# Patient Record
Sex: Male | Born: 1970
Health system: Southern US, Community
[De-identification: ages and names within clinical notes are randomized; demographics above are authoritative.]

## PROBLEM LIST (undated history)

## (undated) DIAGNOSIS — J40 Bronchitis, not specified as acute or chronic: Secondary | ICD-10-CM

## (undated) DIAGNOSIS — E785 Hyperlipidemia, unspecified: Secondary | ICD-10-CM

## (undated) DIAGNOSIS — K8020 Calculus of gallbladder without cholecystitis without obstruction: Secondary | ICD-10-CM

## (undated) DIAGNOSIS — K859 Acute pancreatitis without necrosis or infection, unspecified: Secondary | ICD-10-CM

## (undated) DIAGNOSIS — M1990 Unspecified osteoarthritis, unspecified site: Secondary | ICD-10-CM

## (undated) DIAGNOSIS — T7840XA Allergy, unspecified, initial encounter: Secondary | ICD-10-CM

## (undated) DIAGNOSIS — J189 Pneumonia, unspecified organism: Secondary | ICD-10-CM

## (undated) DIAGNOSIS — K219 Gastro-esophageal reflux disease without esophagitis: Secondary | ICD-10-CM

## (undated) HISTORY — DX: Allergy, unspecified, initial encounter: T78.40XA

## (undated) HISTORY — DX: Acute pancreatitis without necrosis or infection, unspecified: K85.90

## (undated) HISTORY — PX: HIP SURGERY: SHX245

## (undated) HISTORY — PX: SHOULDER SURGERY: SHX246

## (undated) HISTORY — DX: Calculus of gallbladder without cholecystitis without obstruction: K80.20

## (undated) HISTORY — PX: CERVICAL DISC SURGERY: SHX588

---

## 2006-06-12 HISTORY — PX: CERVICAL DISC SURGERY: SHX588

## 2007-01-18 ENCOUNTER — Ambulatory Visit (HOSPITAL_COMMUNITY): Admission: RE | Admit: 2007-01-18 | Discharge: 2007-01-19 | Payer: Self-pay | Admitting: Neurosurgery

## 2007-10-25 ENCOUNTER — Emergency Department (HOSPITAL_COMMUNITY): Admission: EM | Admit: 2007-10-25 | Discharge: 2007-10-26 | Payer: Self-pay | Admitting: Emergency Medicine

## 2007-12-03 ENCOUNTER — Emergency Department (HOSPITAL_COMMUNITY): Admission: EM | Admit: 2007-12-03 | Discharge: 2007-12-04 | Payer: Self-pay | Admitting: Emergency Medicine
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
## 2011-03-09 LAB — URINALYSIS, ROUTINE W REFLEX MICROSCOPIC
Bilirubin Urine: NEGATIVE
Hgb urine dipstick: NEGATIVE
Ketones, ur: NEGATIVE
Specific Gravity, Urine: 1.021
pH: 6

## 2011-03-27 LAB — CBC
HCT: 47.5
Hemoglobin: 16
MCHC: 33.6
MCV: 86.6
RDW: 12.8

## 2011-03-27 LAB — BASIC METABOLIC PANEL
CO2: 29
Chloride: 103
Glucose, Bld: 87
Potassium: 4
Sodium: 140

## 2011-05-20 ENCOUNTER — Encounter: Payer: Self-pay | Admitting: *Deleted

## 2011-05-20 ENCOUNTER — Emergency Department (HOSPITAL_COMMUNITY)
Admission: EM | Admit: 2011-05-20 | Discharge: 2011-05-20 | Disposition: A | Discharge status: Home / Self Care | Payer: 59 | Attending: Emergency Medicine | Admitting: Emergency Medicine

## 2011-05-20 DIAGNOSIS — J40 Bronchitis, not specified as acute or chronic: Secondary | ICD-10-CM | POA: Insufficient documentation

## 2011-05-20 DIAGNOSIS — R5381 Other malaise: Secondary | ICD-10-CM | POA: Insufficient documentation

## 2011-05-20 DIAGNOSIS — R5383 Other fatigue: Secondary | ICD-10-CM | POA: Insufficient documentation

## 2011-05-20 DIAGNOSIS — R062 Wheezing: Secondary | ICD-10-CM | POA: Insufficient documentation

## 2011-05-20 DIAGNOSIS — R197 Diarrhea, unspecified: Secondary | ICD-10-CM | POA: Insufficient documentation

## 2011-05-20 DIAGNOSIS — R0602 Shortness of breath: Secondary | ICD-10-CM | POA: Insufficient documentation

## 2011-05-20 DIAGNOSIS — R05 Cough: Secondary | ICD-10-CM | POA: Insufficient documentation

## 2011-05-20 DIAGNOSIS — R42 Dizziness and giddiness: Secondary | ICD-10-CM | POA: Insufficient documentation

## 2011-05-20 DIAGNOSIS — R059 Cough, unspecified: Secondary | ICD-10-CM | POA: Insufficient documentation

## 2011-05-20 HISTORY — DX: Pneumonia, unspecified organism: J18.9

## 2011-05-20 HISTORY — DX: Bronchitis, not specified as acute or chronic: J40

## 2011-05-20 MED ORDER — CEPHALEXIN 500 MG PO CAPS: 500.0000 mg | ORAL_CAPSULE | Freq: Two times a day (BID) | ORAL | Status: AC

## 2011-05-20 MED ORDER — CEPHALEXIN 250 MG PO CAPS
500.0000 mg | ORAL_CAPSULE | Freq: Once | ORAL | Status: AC
  Administered 2011-05-20: 500 mg via ORAL
  Filled 2011-05-20: qty 2

## 2011-05-20 MED ORDER — ALBUTEROL SULFATE HFA 108 (90 BASE) MCG/ACT IN AERS
2.0000 | INHALATION_SPRAY | Freq: Once | RESPIRATORY_TRACT | Status: AC
  Administered 2011-05-20: 2 via RESPIRATORY_TRACT
  Filled 2011-05-20: qty 6.7

## 2011-05-20 NOTE — ED Provider Notes (Signed)
History     CSN: [id number] Arrival date & time: 05/20/2011  7:25 PM   First MD Initiated Contact with Patient 05/20/11 2106      Chief Complaint  Patient presents with  . Bronchitis    (Consider location/radiation/quality/duration/timing/severity/associated sxs/prior treatment) HPI Comments: Patient here after having been seen at PCP yesterday and having been prescribed clarithromycin for bronchitis.  He states he is now having a reaction to the medication including dizziness, diarrhea, weakness and his symptoms are getting worse.  He reports no fever or chills  Patient is a 40 y.o. male presenting with cough. The history is provided by the patient. No language interpreter was used.  Cough This is a new problem. The current episode started 2 days ago. The problem occurs constantly. The problem has not changed since onset.The cough is non-productive. There has been no fever. Associated symptoms include shortness of breath and wheezing. Pertinent negatives include no chest pain, no chills, no ear congestion, no ear pain, no headaches, no rhinorrhea, no sore throat and no myalgias. The treatment provided no relief. He is not a smoker. His past medical history is significant for bronchitis.    Past Medical History  Diagnosis Date  . Bronchitis   . Pneumonia     Past Surgical History  Procedure Date  . Cervical spine surgery     No family history on file.  History  Substance Use Topics  . Smoking status: Never Smoker   . Smokeless tobacco: Not on file  . Alcohol Use: No      Review of Systems  Constitutional: Negative for chills.  HENT: Negative for ear pain, sore throat and rhinorrhea.   Respiratory: Positive for cough, shortness of breath and wheezing.   Cardiovascular: Negative for chest pain.  Musculoskeletal: Negative for myalgias.  Neurological: Negative for headaches.  All other systems reviewed and are negative.    Allergies  Amoxicillin  Home Medications     Current Outpatient Rx  Name Route Sig Dispense Refill  . CLARITHROMYCIN 500 MG PO TABS Oral Take 500 mg by mouth 2 (two) times daily.        BP 105/65  Pulse 85  Temp(Src) 98.9 F (37.2 C) (Oral)  Resp 20  SpO2 100%  Physical Exam  Nursing note and vitals reviewed. Constitutional: He is oriented to person, place, and time. He appears well-developed and well-nourished. No distress.  HENT:  Head: Normocephalic and atraumatic.  Right Ear: External ear normal.  Left Ear: External ear normal.  Nose: Nose normal.  Mouth/Throat: Oropharynx is clear and moist. No oropharyngeal exudate.  Eyes: Conjunctivae are normal. Pupils are equal, round, and reactive to light. No scleral icterus.  Neck: Normal range of motion. Neck supple.  Cardiovascular: Normal rate, regular rhythm and normal heart sounds.   Pulmonary/Chest: Effort normal and breath sounds normal. No respiratory distress. He has no wheezes. He has no rales.  Abdominal: Soft. Bowel sounds are normal. There is no tenderness.  Musculoskeletal: Normal range of motion.  Lymphadenopathy:    He has no cervical adenopathy.  Neurological: He is alert and oriented to person, place, and time. No cranial nerve deficit.  Skin: Skin is warm and dry.  Psychiatric: He has a normal mood and affect. His behavior is normal. Judgment and thought content normal.    ED Course  Procedures (including critical care time)  Labs Reviewed - No data to display No results found.   Bronchitis    MDM  Patient believes that he  may have had a reaction to the biaxin, though this sounds like the common side effects of this medication, his baseline mental status is such that he is having difficulty comprehending this,  I will switch the abx and also give him an inhaler.         Izola Price [place], [place] 05/20/11 2117

## 2011-05-20 NOTE — ED Notes (Signed)
Patient here with c/o bronchitis is getting worse.  Patient states that he saw his MD yesterday for allergic reaction to his medication and was told by his MD that he had bronchitis.  Patient is c/o symptoms getting worse, coughing, weakness, and having diarrhea.

## 2011-05-20 NOTE — ED Notes (Signed)
PA at bedside.

## 2011-05-20 NOTE — ED Notes (Signed)
Patient states that he was diagnosed with bronchitis yesterday (symptoms include coughing and fever); patient states that the side effects from the medications are affecting him -- nausea, vomiting, diarrhea (black in color), and dizziness.  Last emesis was around 1800; patient currently feels nausea.  Patient denies abdominal pain, chest pain, shortness of breath, and numbness/tingling in hands/feet.  Patient states that every time he cough, he experiences pain under his right rib area.  Patient called pharmacy, which referred him to his PCP.  Patient was unable to get a hold of his PCP, so he came to the ED.  Patient alert and oriented x4; PERRL present.  Patient wearing a mask.  Will continue to monitor.

## 2011-05-20 NOTE — ED Notes (Signed)
Patient given discharge paperwork; went over discharge instructions with patient.  Instructed patient to take antibiotic and albuterol inhaler as directed.  Instructed to follow up with primary care physician if symptoms persist and to return to the ED for new, worsening, or concerning symptoms.

## 2011-05-20 NOTE — ED Notes (Signed)
Attempted to discharge patient; patient currently in restroom.

## 2011-05-21 NOTE — ED Provider Notes (Signed)
Medical screening examination/treatment/procedure(s) were performed by non-physician practitioner and as supervising physician I was immediately available for consultation/collaboration.   Lyanne Co, MD 05/21/11 0100

## 2011-11-06 ENCOUNTER — Emergency Department (HOSPITAL_COMMUNITY)
Admission: EM | Admit: 2011-11-06 | Discharge: 2011-11-06 | Disposition: A | Discharge status: Home / Self Care | Payer: 59 | Attending: Emergency Medicine | Admitting: Emergency Medicine

## 2011-11-06 ENCOUNTER — Encounter (HOSPITAL_COMMUNITY): Payer: Self-pay | Admitting: Emergency Medicine

## 2011-11-06 DIAGNOSIS — R109 Unspecified abdominal pain: Secondary | ICD-10-CM | POA: Insufficient documentation

## 2011-11-06 DIAGNOSIS — K5900 Constipation, unspecified: Secondary | ICD-10-CM | POA: Insufficient documentation

## 2011-11-06 DIAGNOSIS — J329 Chronic sinusitis, unspecified: Secondary | ICD-10-CM

## 2011-11-06 DIAGNOSIS — K219 Gastro-esophageal reflux disease without esophagitis: Secondary | ICD-10-CM | POA: Insufficient documentation

## 2011-11-06 HISTORY — DX: Gastro-esophageal reflux disease without esophagitis: K21.9

## 2011-11-06 LAB — DIFFERENTIAL
Basophils Relative: 0 % (ref 0–1)
Eosinophils Absolute: 0 10*3/uL (ref 0.0–0.7)
Monocytes Relative: 7 % (ref 3–12)
Neutrophils Relative %: 89 % — ABNORMAL HIGH (ref 43–77)

## 2011-11-06 LAB — CBC
Hemoglobin: 13.8 g/dL (ref 13.0–17.0)
MCH: 28.8 pg (ref 26.0–34.0)
MCHC: 34.1 g/dL (ref 30.0–36.0)
Platelets: 200 10*3/uL (ref 150–400)
RDW: 12.7 % (ref 11.5–15.5)

## 2011-11-06 LAB — COMPREHENSIVE METABOLIC PANEL
Albumin: 3.7 g/dL (ref 3.5–5.2)
Alkaline Phosphatase: 48 U/L (ref 39–117)
BUN: 12 mg/dL (ref 6–23)
Potassium: 3.2 mEq/L — ABNORMAL LOW (ref 3.5–5.1)
Total Protein: 6.1 g/dL (ref 6.0–8.3)

## 2011-11-06 MED ORDER — POTASSIUM CHLORIDE CRYS ER 20 MEQ PO TBCR
40.0000 meq | EXTENDED_RELEASE_TABLET | Freq: Once | ORAL | Status: AC
  Administered 2011-11-06: 40 meq via ORAL
  Filled 2011-11-06: qty 2

## 2011-11-06 MED ORDER — SULFAMETHOXAZOLE-TRIMETHOPRIM 800-160 MG PO TABS: 1.0000 | ORAL_TABLET | Freq: Two times a day (BID) | ORAL | Status: AC

## 2011-11-06 MED ORDER — ACETAMINOPHEN 325 MG PO TABS
650.0000 mg | ORAL_TABLET | Freq: Once | ORAL | Status: AC
  Administered 2011-11-06: 650 mg via ORAL
  Filled 2011-11-06: qty 2

## 2011-11-06 MED ORDER — ONDANSETRON 8 MG PO TBDP: 8.0000 mg | ORAL_TABLET | Freq: Three times a day (TID) | ORAL | Status: AC | PRN

## 2011-11-06 MED ORDER — ONDANSETRON 8 MG PO TBDP: 8.0000 mg | ORAL_TABLET | Freq: Three times a day (TID) | ORAL | Status: DC | PRN

## 2011-11-06 NOTE — ED Notes (Signed)
Pt given crackers and ginger ale and pt tolerating well.

## 2011-11-06 NOTE — ED Notes (Signed)
Per EMS pt reports nausea x 2 hours with chills. Pt vomited half a green bag worth on the way in the ambulance. Pt in no acute distress.

## 2011-11-06 NOTE — ED Notes (Signed)
Pt HR 120's on the monitor. And sinus tach on the monitor per EMS.

## 2011-11-06 NOTE — Discharge Instructions (Signed)

## 2011-11-06 NOTE — ED Notes (Signed)
[id number]<AV> Expected date:11/06/11<BR> Expected time:12:20 PM<BR> Means of arrival:Ambulance<BR> Comments:<BR> vomiting

## 2011-11-06 NOTE — ED Provider Notes (Signed)
History     CSN: [id number]  Arrival date & time 11/06/11  1222   First MD Initiated Contact with Patient 11/06/11 1309      Chief Complaint  Patient presents with  . Abdominal Pain  . Constipation    (Consider location/radiation/quality/duration/timing/severity/associated sxs/prior treatment) HPI Patient complaining of sinus symptoms with  Nasal congestion for 3 days now with frontal headache, fever, loose stool earlier today, and emesis.  Patient states he had some abdominal discomfort pte but this has resolved.  The frontal headache is pressure and across the forehead.  PMD is Dr. Juleen China.  Pain is constant, non radiating, moderate and improving.  Patient has not taken any medications.  Past Medical History  Diagnosis Date  . Bronchitis   . Pneumonia   . GERD (gastroesophageal reflux disease)     Past Surgical History  Procedure Date  . Cervical spine surgery     No family history on file.  History  Substance Use Topics  . Smoking status: Never Smoker   . Smokeless tobacco: Not on file  . Alcohol Use: No      Review of Systems  All other systems reviewed and are negative.    Allergies  Amoxicillin  Home Medications   Current Outpatient Rx  Name Route Sig Dispense Refill  . ALBUTEROL SULFATE HFA 108 (90 BASE) MCG/ACT IN AERS Inhalation Inhale 2 puffs into the lungs every 6 (six) hours as needed. For shortness of breath.    . ASPIRIN-ACETAMINOPHEN-CAFFEINE 250-250-65 MG PO TABS Oral Take 2 tablets by mouth every 6 (six) hours as needed. For migraines.    Marland Kitchen DIPHENHYDRAMINE HCL 25 MG PO TABS Oral Take 25 mg by mouth every 6 (six) hours as needed. For allergies.    . ADULT MULTIVITAMIN W/MINERALS CH Oral Take 1 tablet by mouth daily.    Marland Kitchen OMEPRAZOLE 20 MG PO CPDR Oral Take 20 mg by mouth daily.      BP 114/74  Pulse 120  Temp 100.9 F (38.3 C)  Resp 20  SpO2 99%  Physical Exam  Nursing note and vitals reviewed. Constitutional: He is oriented to person,  place, and time. He appears well-developed and well-nourished.  HENT:  Head: Normocephalic and atraumatic.  Right Ear: External ear normal.  Left Ear: External ear normal.  Nose: Nose normal.  Mouth/Throat: Oropharynx is clear and moist.  Eyes: EOM are normal. Pupils are equal, round, and reactive to light.  Neck: Normal range of motion. Neck supple.  Cardiovascular: Tachycardia present.   Pulmonary/Chest: Effort normal and breath sounds normal.  Abdominal: Soft. Bowel sounds are normal.  Musculoskeletal: Normal range of motion.  Neurological: He is alert and oriented to person, place, and time. He has normal reflexes.  Skin: Skin is warm and dry.  Psychiatric: He has a normal mood and affect.    ED Course  Procedures (including critical care time)  Labs Reviewed - No data to display No results found.   No diagnosis found.  Results for orders placed during the hospital encounter of 11/06/11  CBC      Component Value Range   WBC 12.6 (*) 4.0 - 10.5 (K/uL)   RBC 4.79  4.22 - 5.81 (MIL/uL)   Hemoglobin 13.8  13.0 - 17.0 (g/dL)   HCT 11.9  14.7 - 82.9 (%)   MCV 84.6  78.0 - 100.0 (fL)   MCH 28.8  26.0 - 34.0 (pg)   MCHC 34.1  30.0 - 36.0 (g/dL)   RDW 12.7  11.5 - 15.5 (%)   Platelets 200  150 - 400 (K/uL)  DIFFERENTIAL      Component Value Range   Neutrophils Relative 89 (*) 43 - 77 (%)   Neutro Abs 11.2 (*) 1.7 - 7.7 (K/uL)   Lymphocytes Relative 3 (*) 12 - 46 (%)   Lymphs Abs 0.4 (*) 0.7 - 4.0 (K/uL)   Monocytes Relative 7  3 - 12 (%)   Monocytes Absolute 0.9  0.1 - 1.0 (K/uL)   Eosinophils Relative 0  0 - 5 (%)   Eosinophils Absolute 0.0  0.0 - 0.7 (K/uL)   Basophils Relative 0  0 - 1 (%)   Basophils Absolute 0.0  0.0 - 0.1 (K/uL)  COMPREHENSIVE METABOLIC PANEL      Component Value Range   Sodium 139  135 - 145 (mEq/L)   Potassium 3.2 (*) 3.5 - 5.1 (mEq/L)   Chloride 104  96 - 112 (mEq/L)   CO2 25  19 - 32 (mEq/L)   Glucose, Bld 86  70 - 99 (mg/dL)   BUN 12  6  - 23 (mg/dL)   Creatinine, Ser 1.61  0.50 - 1.35 (mg/dL)   Calcium 8.3 (*) 8.4 - 10.5 (mg/dL)   Total Protein 6.1  6.0 - 8.3 (g/dL)   Albumin 3.7  3.5 - 5.2 (g/dL)   AST 20  0 - 37 (U/L)   ALT 21  0 - 53 (U/L)   Alkaline Phosphatase 48  39 - 117 (U/L)   Total Bilirubin 0.8  0.3 - 1.2 (mg/dL)   GFR calc non Af Amer 75 (*) >90 (mL/min)   GFR calc Af Amer 87 (*) >90 (mL/min)     MDM  Patient given 2 lites ns and tylenol. Potassium orally repleted. Plan po abx for sinusitis and antiemetics       Hilario Quarry, MD 11/06/11 [phone]

## 2011-11-07 ENCOUNTER — Emergency Department (HOSPITAL_COMMUNITY)
Admission: EM | Admit: 2011-11-07 | Discharge: 2011-11-07 | Disposition: A | Discharge status: Home / Self Care | Payer: 59 | Attending: Emergency Medicine | Admitting: Emergency Medicine

## 2011-11-07 ENCOUNTER — Encounter (HOSPITAL_COMMUNITY): Payer: Self-pay | Admitting: Emergency Medicine

## 2011-11-07 DIAGNOSIS — I10 Essential (primary) hypertension: Secondary | ICD-10-CM | POA: Insufficient documentation

## 2011-11-07 DIAGNOSIS — R112 Nausea with vomiting, unspecified: Secondary | ICD-10-CM

## 2011-11-07 DIAGNOSIS — T887XXA Unspecified adverse effect of drug or medicament, initial encounter: Secondary | ICD-10-CM | POA: Insufficient documentation

## 2011-11-07 DIAGNOSIS — J3489 Other specified disorders of nose and nasal sinuses: Secondary | ICD-10-CM | POA: Insufficient documentation

## 2011-11-07 DIAGNOSIS — R42 Dizziness and giddiness: Secondary | ICD-10-CM | POA: Insufficient documentation

## 2011-11-07 DIAGNOSIS — T50905A Adverse effect of unspecified drugs, medicaments and biological substances, initial encounter: Secondary | ICD-10-CM

## 2011-11-07 LAB — POCT I-STAT, CHEM 8
BUN: 8 mg/dL (ref 6–23)
Creatinine, Ser: 1.3 mg/dL (ref 0.50–1.35)
Potassium: 3.9 mEq/L (ref 3.5–5.1)
Sodium: 140 mEq/L (ref 135–145)

## 2011-11-07 MED ORDER — DOXYCYCLINE HYCLATE 100 MG PO CAPS: 100.0000 mg | ORAL_CAPSULE | Freq: Two times a day (BID) | ORAL | Status: AC

## 2011-11-07 MED ORDER — KETOROLAC TROMETHAMINE 30 MG/ML IJ SOLN
30.0000 mg | Freq: Once | INTRAMUSCULAR | Status: AC
  Administered 2011-11-07: 30 mg via INTRAVENOUS
  Filled 2011-11-07: qty 1

## 2011-11-07 MED ORDER — SODIUM CHLORIDE 0.9 % IV BOLUS (SEPSIS)
1000.0000 mL | Freq: Once | INTRAVENOUS | Status: AC
  Administered 2011-11-07: 1000 mL via INTRAVENOUS

## 2011-11-07 NOTE — ED Provider Notes (Signed)
History     CSN: [id number]  Arrival date & time 11/07/11  0113   First MD Initiated Contact with Patient 11/07/11 [phone]      Chief Complaint  Patient presents with  . Headache  . Nausea    HPI  History provided by the patient. Patient is a 41 year old male with history of bronchitis and cancer social reflux who presents with complaints of nausea vomiting symptoms. Patient reports being seen for similar symptoms with sinus congestion and pressure yesterday. He was given a prescription for an antibiotic for a sinus infection. Patient took one dose and reports having onset of increased nausea symptoms with slight lightheadedness 30 minutes after taking the medication. Patient does report taking this on an empty stomach. He does not know of any prior allergic reactions or adverse reactions to his medicine. Patient also states that he feels dehydrated. He has been drinking fluids. Patient has no history of diabetes. Patient has no other complaints. He denies any fever, chills, sweats.   Past Medical History  Diagnosis Date  . Bronchitis   . Pneumonia   . GERD (gastroesophageal reflux disease)     Past Surgical History  Procedure Date  . Cervical spine surgery     Family History  Problem Relation Age of Onset  . Hypertension Other   . Heart failure Other     History  Substance Use Topics  . Smoking status: Never Smoker   . Smokeless tobacco: Not on file  . Alcohol Use: No      Review of Systems  Constitutional: Negative for fever and chills.  HENT: Positive for congestion and sinus pressure. Negative for rhinorrhea.   Respiratory: Negative for shortness of breath.   Gastrointestinal: Positive for nausea and vomiting. Negative for abdominal pain.    Allergies  Amoxicillin  Home Medications   Current Outpatient Rx  Name Route Sig Dispense Refill  . ALBUTEROL SULFATE HFA 108 (90 BASE) MCG/ACT IN AERS Inhalation Inhale 2 puffs into the lungs every 6 (six) hours as  needed. For shortness of breath.    . ASPIRIN-ACETAMINOPHEN-CAFFEINE 250-250-65 MG PO TABS Oral Take 2 tablets by mouth every 6 (six) hours as needed. For migraines.    Marland Kitchen DIPHENHYDRAMINE HCL 25 MG PO TABS Oral Take 25 mg by mouth every 6 (six) hours as needed. For allergies.    . ADULT MULTIVITAMIN W/MINERALS CH Oral Take 1 tablet by mouth daily.    Marland Kitchen OMEPRAZOLE 20 MG PO CPDR Oral Take 20 mg by mouth daily.    Marland Kitchen ONDANSETRON 8 MG PO TBDP Oral Take 1 tablet (8 mg total) by mouth every 8 (eight) hours as needed for nausea. 20 tablet 0  . SULFAMETHOXAZOLE-TRIMETHOPRIM 800-160 MG PO TABS Oral Take 1 tablet by mouth every 12 (twelve) hours. 20 tablet 0    BP 108/67  Pulse 113  Temp(Src) 98.8 F (37.1 C) (Oral)  Resp 20  Wt 121 lb 6.4 oz (55.067 kg)  SpO2 95%  Physical Exam  Nursing note and vitals reviewed. Constitutional: He is oriented to person, place, and time. He appears well-developed and well-nourished. No distress.  HENT:  Head: Normocephalic and atraumatic.       Nasal congestion present.  Cardiovascular: Normal rate and regular rhythm.   Pulmonary/Chest: Effort normal and breath sounds normal. No respiratory distress. He has no wheezes.  Abdominal: Soft. There is no tenderness. There is no rebound.  Neurological: He is alert and oriented to person, place, and time.  Skin: Skin  is warm.  Psychiatric: He has a normal mood and affect. His behavior is normal.    ED Course  Procedures   Results for orders placed during the hospital encounter of 11/07/11  POCT I-STAT, CHEM 8      Component Value Range   Sodium 140  135 - 145 (mEq/L)   Potassium 3.9  3.5 - 5.1 (mEq/L)   Chloride 105  96 - 112 (mEq/L)   BUN 8  6 - 23 (mg/dL)   Creatinine, Ser 1.61  0.50 - 1.35 (mg/dL)   Glucose, Bld 97  70 - 99 (mg/dL)   Calcium, Ion 0.96  0.45 - 1.32 (mmol/L)   TCO2 23  0 - 100 (mmol/L)   Hemoglobin 13.6  13.0 - 17.0 (g/dL)   HCT 40.9  81.1 - 91.4 (%)       1. Nausea & vomiting   2.  Adverse drug reaction       MDM  Patient seen and evaluated. Patient in no acute distress.   Patient feeling much better after medications. I discussed possibility of nausea related to medications. Symptoms didn't begin to 30 minutes after taking Bactrim. At this time we will try a different antibiotic for sinusitis symptoms.     Angus Seller, [place] 11/08/11 [phone]

## 2011-11-07 NOTE — Discharge Instructions (Signed)
You were seen and evaluated for your nausea vomiting symptoms and general fatigue. Your providers feel that your nausea they have come on from taking your antibiotic medications and having irritation and bad reaction. You have been given a new prescription for a different antibiotic to use for your sinus infection. Please continue to drink plenty of fluids to stay hydrated. Take this medication after eating food. Please followup with your primary care provider for continued evaluation and treatment.   Nausea and Vomiting Nausea is a sick feeling that often comes before throwing up (vomiting). Vomiting is a reflex where stomach contents come out of your mouth. Vomiting can cause severe loss of body fluids (dehydration). Children and elderly adults can become dehydrated quickly, especially if they also have diarrhea. Nausea and vomiting are symptoms of a condition or disease. It is important to find the cause of your symptoms. CAUSES   Direct irritation of the stomach lining. This irritation can result from increased acid production (gastroesophageal reflux disease), infection, food poisoning, taking certain medicines (such as nonsteroidal anti-inflammatory drugs), alcohol use, or tobacco use.   Signals from the brain.These signals could be caused by a headache, heat exposure, an inner ear disturbance, increased pressure in the brain from injury, infection, a tumor, or a concussion, pain, emotional stimulus, or metabolic problems.   An obstruction in the gastrointestinal tract (bowel obstruction).   Illnesses such as diabetes, hepatitis, gallbladder problems, appendicitis, kidney problems, cancer, sepsis, atypical symptoms of a heart attack, or eating disorders.   Medical treatments such as chemotherapy and radiation.   Receiving medicine that makes you sleep (general anesthetic) during surgery.  DIAGNOSIS Your caregiver may ask for tests to be done if the problems do not improve after a few days.  Tests may also be done if symptoms are severe or if the reason for the nausea and vomiting is not clear. Tests may include:  Urine tests.   Blood tests.   Stool tests.   Cultures (to look for evidence of infection).   X-rays or other imaging studies.  Test results can help your caregiver make decisions about treatment or the need for additional tests. TREATMENT You need to stay well hydrated. Drink frequently but in small amounts.You may wish to drink water, sports drinks, clear broth, or eat frozen ice pops or gelatin dessert to help stay hydrated.When you eat, eating slowly may help prevent nausea.There are also some antinausea medicines that may help prevent nausea. HOME CARE INSTRUCTIONS   Take all medicine as directed by your caregiver.   If you do not have an appetite, do not force yourself to eat. However, you must continue to drink fluids.   If you have an appetite, eat a normal diet unless your caregiver tells you differently.   Eat a variety of complex carbohydrates (rice, wheat, potatoes, bread), lean meats, yogurt, fruits, and vegetables.   Avoid high-fat foods because they are more difficult to digest.   Drink enough water and fluids to keep your urine clear or pale yellow.   If you are dehydrated, ask your caregiver for specific rehydration instructions. Signs of dehydration may include:   Severe thirst.   Dry lips and mouth.   Dizziness.   Dark urine.   Decreasing urine frequency and amount.   Confusion.   Rapid breathing or pulse.  SEEK IMMEDIATE MEDICAL CARE IF:   You have blood or brown flecks (like coffee grounds) in your vomit.   You have black or bloody stools.   You  have a severe headache or stiff neck.   You are confused.   You have severe abdominal pain.   You have chest pain or trouble breathing.   You do not urinate at least once every 8 hours.   You develop cold or clammy skin.   You continue to vomit for longer than 24 to 48  hours.   You have a fever.  MAKE SURE YOU:   Understand these instructions.   Will watch your condition.   Will get help right away if you are not doing well or get worse.  Document Released: 05/29/2005 Document Revised: 05/18/2011 Document Reviewed: 10/26/2010 [facility] Patient Information 2012 [place], [place].   RESOURCE GUIDE  Dental Problems  Patients with Medicaid: [facility] [phone] [address]                                           [phone] [address] Phone:  [phone]                                                  Phone:  [phone]  If unable to pay or uninsured, contact:  [facility] or [facility]. to become qualified for the adult dental clinic.  Chronic Pain Problems Contact [facility]  [phone] Patients need to be referred by their primary care doctor.  Insufficient Money for Medicine Contact United Way:  call "211" or [facility] 315-368-5726.  No Primary Care Doctor Call [facility]  628-457-3473 Other agencies that provide inexpensive medical care    [facility]  7061122315    [facility]  [phone]    [facility]  [phone]    [facility]  [phone]    [facility]  [phone]    [facility]  [phone]  Psychological Services [facility]  [phone] [facility]  [phone] [facility]   [phone] (emergency services ([phone])  Substance Abuse Resources [facility]  [phone] [facility] [phone] [facility] [phone] Floydene Flock [phone] Residential & Outpatient Substance Abuse Program  ([phone]  Abuse/Neglect [facility] Child Abuse Hotline 229-219-6027 [facility] Child Abuse Hotline 506-386-0638 (After Hours)  Emergency Shelter [facility] 2344464302  Maternity  Homes [facility] at [facility] [place] [facility] [place] [phone] [facility] [phone]  MRSA Hotline #:   6023088736    [facility] Resources  [facility]     [employer]                          [facility] Dept. [address] [place]                       [address]      [address]  [address]  Sela Hua Phone:  [phone]                                   Phone:  [phone]                 Phone:  [place] Phone:  [place] [phone] (After Hours)

## 2011-11-07 NOTE — ED Notes (Signed)
Pt states about 11 pm he was going to the restroom and felt like he was going to pass out and was brought in by EMS on Monday and was told he had a sinus infection  Pt states he went home about 6pm on Monday  Pt states he continues to feel dehydrated and is nauseated  Pt is c/o headache

## 2011-11-07 NOTE — ED Notes (Signed)
Notified Md of vitals .  Ok to d/c home.

## 2011-11-09 NOTE — ED Provider Notes (Signed)
Medical screening examination/treatment/procedure(s) were performed by non-physician practitioner and as supervising physician I was immediately available for consultation/collaboration.    , MD 11/09/11 2001 

## 2012-02-09 ENCOUNTER — Encounter (HOSPITAL_COMMUNITY): Payer: Self-pay | Admitting: Emergency Medicine

## 2012-02-09 ENCOUNTER — Emergency Department (INDEPENDENT_AMBULATORY_CARE_PROVIDER_SITE_OTHER)
Admission: EM | Admit: 2012-02-09 | Discharge: 2012-02-09 | Disposition: A | Discharge status: Home / Self Care | Payer: Worker's Compensation | Source: Home / Self Care | Attending: Emergency Medicine | Admitting: Emergency Medicine

## 2012-02-09 DIAGNOSIS — L255 Unspecified contact dermatitis due to plants, except food: Secondary | ICD-10-CM

## 2012-02-09 DIAGNOSIS — L237 Allergic contact dermatitis due to plants, except food: Secondary | ICD-10-CM

## 2012-02-09 MED ORDER — METHYLPREDNISOLONE ACETATE 80 MG/ML IJ SUSP: 80.0000 mg | Freq: Once | INTRAMUSCULAR | Status: DC

## 2012-02-09 MED ORDER — METHYLPREDNISOLONE ACETATE 80 MG/ML IJ SUSP
INTRAMUSCULAR | Status: AC
  Filled 2012-02-09: qty 1

## 2012-02-09 MED ORDER — TRIAMCINOLONE ACETONIDE 0.1 % EX CREA: TOPICAL_CREAM | Freq: Three times a day (TID) | CUTANEOUS | Status: AC

## 2012-02-09 MED ORDER — PREDNISONE 10 MG PO TABS: ORAL_TABLET | ORAL | Status: DC

## 2012-02-09 NOTE — ED Notes (Signed)
C/o poison ivy from exposure on job w [employer]

## 2012-02-09 NOTE — ED Provider Notes (Signed)
Chief Complaint  Patient presents with  . 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 For the past 2-3 days he's had a pruritic rash on his thighs and arms. He was exposed to some poison ivy at work several days before the symptoms began. He denies any breathing difficulties, coughing, wheezing, or swelling of the lips, tongue, or throat.  Review of Systems:  Other than noted above, the patient denies any of the following symptoms: Systemic:  No fever, chills, sweats, weight loss, or fatigue. ENT:  No nasal congestion, rhinorrhea, sore throat, swelling of lips, tongue or throat. Resp:  No cough, wheezing, or shortness of breath. Skin:  No rash, itching, nodules, or suspicious lesions.  PMFSH:  Past medical history, family history, social history, meds, and allergies were reviewed.  Physical Exam:   Vital signs:  BP 121/79  Pulse 66  Temp 98.2 F (36.8 C) (Oral)  Resp 16  SpO2 100% Gen:  Alert, oriented, in no distress. ENT:  Pharynx clear, no intraoral lesions, moist mucous membranes. Lungs:  Clear to auscultation. Skin:  He has streaks and patches of maculopapules and vesicles on his thighs and arms.  Course in Urgent Care Center:   He was given Depo-Medrol 80 mg IM and tolerated this well without any immediate side effects.  Assessment:  The encounter diagnosis was Poison ivy.  Plan:   1.  The following meds were prescribed:   New Prescriptions   PREDNISONE (DELTASONE) 10 MG TABLET    Take 4 tabs daily for 4 days, 3 tabs daily for 4 days, 2 tabs daily for 4 days, then 1 tab daily for 4 days.   TRIAMCINOLONE CREAM (KENALOG) 0.1 %    Apply topically 3 (three) times daily.   2.  The patient was instructed in symptomatic care and handouts were given. 3.  The patient was told to return if becoming worse in any way, if no better in 3 or 4 days, and given some red flag symptoms that would indicate  earlier return.     Reuben Likes, MD 02/09/12 2150

## 2012-08-22 ENCOUNTER — Ambulatory Visit: Payer: 59

## 2012-08-22 ENCOUNTER — Ambulatory Visit (INDEPENDENT_AMBULATORY_CARE_PROVIDER_SITE_OTHER): Payer: 59 | Admitting: Emergency Medicine

## 2012-08-22 VITALS — BP 112/72 | HR 100 | Temp 98.2°F | Resp 16 | Ht 62.0 in | Wt 118.0 lb

## 2012-08-22 DIAGNOSIS — I959 Hypotension, unspecified: Secondary | ICD-10-CM

## 2012-08-22 DIAGNOSIS — R059 Cough, unspecified: Secondary | ICD-10-CM

## 2012-08-22 DIAGNOSIS — J811 Chronic pulmonary edema: Secondary | ICD-10-CM

## 2012-08-22 DIAGNOSIS — R05 Cough: Secondary | ICD-10-CM

## 2012-08-22 DIAGNOSIS — H6120 Impacted cerumen, unspecified ear: Secondary | ICD-10-CM

## 2012-08-22 DIAGNOSIS — J019 Acute sinusitis, unspecified: Secondary | ICD-10-CM

## 2012-08-22 LAB — POCT CBC
Lymph, poc: 0.8 (ref 0.6–3.4)
MCHC: 32.9 g/dL (ref 31.8–35.4)
MID (cbc): 0.5 (ref 0–0.9)
MPV: 10.5 fL (ref 0–99.8)
POC Granulocyte: 5.9 (ref 2–6.9)
POC LYMPH PERCENT: 10.9 %L (ref 10–50)
POC MID %: 6.4 %M (ref 0–12)
RDW, POC: 13.4 %

## 2012-08-22 MED ORDER — BENZONATATE 100 MG PO CAPS: 100.0000 mg | ORAL_CAPSULE | Freq: Three times a day (TID) | ORAL | Status: DC | PRN

## 2012-08-22 MED ORDER — LEVOFLOXACIN 500 MG PO TABS: 500.0000 mg | ORAL_TABLET | Freq: Every day | ORAL | Status: AC

## 2012-08-22 NOTE — Progress Notes (Signed)
While reviewing chart - Dr Cleta Alberts became concerned regarding his low blood pressure and had the patient return to the clinic for orthostatics.  He was mildly orthostatic upon his arrival.  An IV was started and he was given 2L NS and his vitals improved and he felt much better. He was discharged home to push fluids take medications and with instructions to return tomorrow if he was worse.

## 2012-08-22 NOTE — Progress Notes (Deleted)
  Subjective:    Patient ID: Harry Morales, male    DOB: [birth year], 42 y.o.   MRN: [id number]  HPI    Review of Systems     Objective:   Physical Exam        Assessment & Plan:

## 2012-08-22 NOTE — Progress Notes (Signed)
  Subjective:    Patient ID: Harry Morales, male    DOB: 04-26-1971, 42 y.o.   MRN: [id number]  URI  This is a new problem. The current episode started in the past 7 days (8 days). The problem has been gradually worsening. There has been no fever. Associated symptoms include congestion, coughing, headaches, nausea, sinus pain and sneezing. He has tried antihistamine for the symptoms. The treatment provided no relief.  Cough Associated symptoms include headaches and postnasal drip. Pertinent negatives include no chills or fever.   Patient comes into our office today with complaints  Of a bad cough with sinus and chest congestion that has lasted for 8 day has been taking nyiquil, delsym, dayquil, and benadryl nothing has helped with cough. Cough gets worse at night.He coughs up yellow Phlegm.No chest pain. No weakness or dizziness.No shortness of breath.    Review of Systems  Constitutional: Positive for fatigue. Negative for fever and chills.  HENT: Positive for congestion, sneezing and postnasal drip.   Respiratory: Positive for cough.   Gastrointestinal: Positive for nausea.  Neurological: Positive for headaches.       Objective:   Physical Examthere is scaring of the left tympanic membrain right wax in right ear nose congested. Throat normal. Chest revealsRhochi in both lungs fields.Patient alert cooperative in NAD.  [facility] reading (PRIMARY) by  Dr. NAD        Assessment & Plan:  Will treat with levaquin,tessalon, and mucinex.BP low. No signs of sepsis. Alert and conversive. Ambulates without difficulty. Advised to rest and drink lots and recheck if worsening.

## 2012-08-22 NOTE — Patient Instructions (Signed)
Sinusitis Sinusitis is redness, soreness, and swelling (inflammation) of the paranasal sinuses. Paranasal sinuses are air pockets within the bones of your face (beneath the eyes, the middle of the forehead, or above the eyes). In healthy paranasal sinuses, mucus is able to drain out, and air is able to circulate through them by way of your nose. However, when your paranasal sinuses are inflamed, mucus and air can become trapped. This can allow bacteria and other germs to grow and cause infection. Sinusitis can develop quickly and last only a short time (acute) or continue over a long period (chronic). Sinusitis that lasts for more than 12 weeks is considered chronic.  CAUSES  Causes of sinusitis include:  Allergies.  Structural abnormalities, such as displacement of the cartilage that separates your nostrils (deviated septum), which can decrease the air flow through your nose and sinuses and affect sinus drainage.  Functional abnormalities, such as when the small hairs (cilia) that line your sinuses and help remove mucus do not work properly or are not present. SYMPTOMS  Symptoms of acute and chronic sinusitis are the same. The primary symptoms are pain and pressure around the affected sinuses. Other symptoms include:  Upper toothache.  Earache.  Headache.  Bad breath.  Decreased sense of smell and taste.  A cough, which worsens when you are lying flat.  Fatigue.  Fever.  Thick drainage from your nose, which often is green and may contain pus (purulent).  Swelling and warmth over the affected sinuses. DIAGNOSIS  Your caregiver will perform a physical exam. During the exam, your caregiver may:  Look in your nose for signs of abnormal growths in your nostrils (nasal polyps).  Tap over the affected sinus to check for signs of infection.  View the inside of your sinuses (endoscopy) with a special imaging device with a light attached (endoscope), which is inserted into your  sinuses. If your caregiver suspects that you have chronic sinusitis, one or more of the following tests may be recommended:  Allergy tests.  Nasal culture A sample of mucus is taken from your nose and sent to a lab and screened for bacteria.  Nasal cytology A sample of mucus is taken from your nose and examined by your caregiver to determine if your sinusitis is related to an allergy. TREATMENT  Most cases of acute sinusitis are related to a viral infection and will resolve on their own within 10 days. Sometimes medicines are prescribed to help relieve symptoms (pain medicine, decongestants, nasal steroid sprays, or saline sprays).  However, for sinusitis related to a bacterial infection, your caregiver will prescribe antibiotic medicines. These are medicines that will help kill the bacteria causing the infection.  Rarely, sinusitis is caused by a fungal infection. In theses cases, your caregiver will prescribe antifungal medicine. For some cases of chronic sinusitis, surgery is needed. Generally, these are cases in which sinusitis recurs more than 3 times per year, despite other treatments. HOME CARE INSTRUCTIONS   Drink plenty of water. Water helps thin the mucus so your sinuses can drain more easily.  Use a humidifier.  Inhale steam 3 to 4 times a day (for example, sit in the bathroom with the shower running).  Apply a warm, moist washcloth to your face 3 to 4 times a day, or as directed by your caregiver.  Use saline nasal sprays to help moisten and clean your sinuses.  Take over-the-counter or prescription medicines for pain, discomfort, or fever only as directed by your caregiver. SEEK IMMEDIATE MEDICAL   CARE IF:  You have increasing pain or severe headaches.  You have nausea, vomiting, or drowsiness.  You have swelling around your face.  You have vision problems.  You have a stiff neck.  You have difficulty breathing. MAKE SURE YOU:   Understand these  instructions.  Will watch your condition.  Will get help right away if you are not doing well or get worse. Document Released: 05/29/2005 Document Revised: 08/21/2011 Document Reviewed: 06/13/2011 [employer] Patient Information 2013 [employer]. Acute Bronchitis You have acute bronchitis. This means you have a chest cold. The airways in your lungs are red and sore (inflamed). Acute means it is sudden onset.  CAUSES Bronchitis is most often caused by the same virus that causes a cold. SYMPTOMS   Body aches.  Chest congestion.  Chills.  Cough.  Fever.  Shortness of breath.  Sore throat. TREATMENT  Acute bronchitis is usually treated with rest, fluids, and medicines for relief of fever or cough. Most symptoms should go away after a few days or a week. Increased fluids may help thin your secretions and will prevent dehydration. Your caregiver may give you an inhaler to improve your symptoms. The inhaler reduces shortness of breath and helps control cough. You can take over-the-counter pain relievers or cough medicine to decrease coughing, pain, or fever. A cool-air vaporizer may help thin bronchial secretions and make it easier to clear your chest. Antibiotics are usually not needed but can be prescribed if you smoke, are seriously ill, have chronic lung problems, are elderly, or you are at higher risk for developing complications.Allergies and asthma can make bronchitis worse. Repeated episodes of bronchitis may cause longstanding lung problems. Avoid smoking and secondhand smoke.Exposure to cigarette smoke or irritating chemicals will make bronchitis worse. If you are a cigarette smoker, consider using nicotine gum or skin patches to help control withdrawal symptoms. Quitting smoking will help your lungs heal faster. Recovery from bronchitis is often slow, but you should start feeling better after 2 to 3 days. Cough from bronchitis frequently lasts for 3 to 4 weeks. To prevent  another bout of acute bronchitis:  Quit smoking.  Wash your hands frequently to get rid of viruses or use a hand sanitizer.  Avoid other people with cold or virus symptoms.  Try not to touch your hands to your mouth, nose, or eyes. SEEK IMMEDIATE MEDICAL CARE IF:  You develop increased fever, chills, or chest pain.  You have severe shortness of breath or bloody sputum.  You develop dehydration, fainting, repeated vomiting, or a severe headache.  You have no improvement after 1 week of treatment or you get worse. MAKE SURE YOU:   Understand these instructions.  Will watch your condition.  Will get help right away if you are not doing well or get worse. Document Released: 07/06/2004 Document Revised: 08/21/2011 Document Reviewed: 09/21/2010 [employer] Patient Information 2013 [employer].  

## 2012-08-22 NOTE — Progress Notes (Signed)
x

## 2012-08-24 ENCOUNTER — Telehealth: Payer: Self-pay | Admitting: *Deleted

## 2012-08-24 NOTE — Telephone Encounter (Signed)
Dr Cleta Alberts advised pt to get neosynephrine otc nasal spray.

## 2012-08-26 ENCOUNTER — Telehealth: Payer: Self-pay

## 2012-08-26 NOTE — Telephone Encounter (Signed)
Note provided for patient  

## 2012-08-26 NOTE — Telephone Encounter (Signed)
PT STATES DR DAUB WAS GOING TO WRITE HIM A NOTE IF HE NEEDED IT AND WOULD LIKE TO HAVE ONE FOR TODAY AND TOMORROW PLEASE CALL [phone]

## 2013-02-21 ENCOUNTER — Ambulatory Visit (INDEPENDENT_AMBULATORY_CARE_PROVIDER_SITE_OTHER): Payer: 59 | Admitting: Physician Assistant

## 2013-02-21 VITALS — BP 120/72 | HR 70 | Temp 98.0°F | Resp 18 | Ht 62.5 in | Wt 126.0 lb

## 2013-02-21 DIAGNOSIS — M79645 Pain in left finger(s): Secondary | ICD-10-CM

## 2013-02-21 DIAGNOSIS — M79609 Pain in unspecified limb: Secondary | ICD-10-CM

## 2013-02-21 DIAGNOSIS — S61209A Unspecified open wound of unspecified finger without damage to nail, initial encounter: Secondary | ICD-10-CM

## 2013-02-21 NOTE — Patient Instructions (Addendum)

## 2013-02-21 NOTE — Progress Notes (Signed)
  Subjective:    Patient ID: Harry Morales, male    DOB: 28-Jul-1970, 42 y.o.   MRN: [id number]  HPI 42 year old male presents with laceration to left index finger. Injury occurred this morning on a paper shredder.  States he thinks it is small but due to the amount of bleeding after the injury, decided he should come in for evaluation and possible sutures.  No paresthesias or weakness. Last tetanus 3 years ago.   Patient is otherwise doing well with no other concerns today.      Review of Systems  Musculoskeletal: Negative for joint swelling.  Skin: Positive for wound.       Objective:   Physical Exam  Constitutional: He is oriented to person, place, and time. He appears well-developed and well-nourished.  HENT:  Head: Normocephalic and atraumatic.  Right Ear: External ear normal.  Left Ear: External ear normal.  Eyes: Conjunctivae are normal.  Cardiovascular: Normal rate.   Pulmonary/Chest: Effort normal.  Neurological: He is alert and oriented to person, place, and time.  Skin:  1 cm laceration on pad of left index finger. Superficial without involvement of deeper structures.  5/5 strength. Sensation normal. Capillary refill <2 seconds  Psychiatric: He has a normal mood and affect. His behavior is normal. Judgment and thought content normal.     VCO. Metacarpal block with 2% lidocaine plain.  Cleaned with soap and water.  SP. Wound explored revealing no deep structures or FB.  Repaired with #2 SI sutures using 5-0 ethilon. Cleaned and bandaged. Patient tolerated well.      Assessment & Plan:  Wound, open, finger, initial encounter  Pain in finger of left hand  Wound care provided for patient RTC precautions discussed Return in 7 days for suture removal.

## 2013-02-27 ENCOUNTER — Ambulatory Visit (INDEPENDENT_AMBULATORY_CARE_PROVIDER_SITE_OTHER): Payer: 59 | Admitting: Physician Assistant

## 2013-02-27 DIAGNOSIS — Z4802 Encounter for removal of sutures: Secondary | ICD-10-CM

## 2013-02-27 NOTE — Progress Notes (Signed)
  Subjective:    Patient ID: Harry Morales, male    DOB: 18-Jul-1970, 42 y.o.   MRN: [id number]  HPI   Harry Morales is a very pleasant 42 yr old male here for removal of sutures placed here 02/21/13.  Reports he is doing well.  No pain, drainage, swelling, redness.   Review of Systems  Skin: Positive for wound.  All other systems reviewed and are negative.       Objective:   Physical Exam  Vitals reviewed. Constitutional: He is oriented to person, place, and time. He appears well-developed and well-nourished. No distress.  HENT:  Head: Normocephalic and atraumatic.  Eyes: Conjunctivae are normal. No scleral icterus.  Pulmonary/Chest: Effort normal.  Neurological: He is alert and oriented to person, place, and time.  Skin: Skin is warm and dry.  Well healed laceration at left index finger; sutures removed without difficulty  Psychiatric: He has a normal mood and affect. His behavior is normal.        Assessment & Plan:  Visit for suture removal   Harry Morales is a pleasant 42 yr old male here for removal of sutures.  Wound is well healed.  Sutures removed without difficulty.  RTC as needs arise.

## 2013-03-07 ENCOUNTER — Ambulatory Visit (INDEPENDENT_AMBULATORY_CARE_PROVIDER_SITE_OTHER): Payer: 59 | Admitting: Emergency Medicine

## 2013-03-07 VITALS — BP 112/80 | HR 102 | Temp 98.8°F | Resp 18 | Ht 61.75 in | Wt 124.8 lb

## 2013-03-07 DIAGNOSIS — S139XXA Sprain of joints and ligaments of unspecified parts of neck, initial encounter: Secondary | ICD-10-CM

## 2013-03-07 MED ORDER — CYCLOBENZAPRINE HCL 10 MG PO TABS: 10.0000 mg | ORAL_TABLET | Freq: Three times a day (TID) | ORAL | Status: DC | PRN

## 2013-03-07 MED ORDER — TRAMADOL HCL 50 MG PO TABS
50.0000 mg | ORAL_TABLET | Freq: Four times a day (QID) | ORAL | Status: DC | PRN
Start: 2013-03-07 — End: 2013-03-18

## 2013-03-07 MED ORDER — NAPROXEN SODIUM 550 MG PO TABS: 550.0000 mg | ORAL_TABLET | Freq: Two times a day (BID) | ORAL | Status: DC

## 2013-03-07 NOTE — Patient Instructions (Addendum)

## 2013-03-07 NOTE — Progress Notes (Signed)
[facility] [address], [place] [address] [phone]  Date:  03/07/2013   Name:  Harry Morales   DOB:  18-Feb-1971   MRN:  [id number]  PCP:  No primary provider on file.    Chief Complaint: Neck Pain   History of Present Illness:  Harry Morales is a 42 y.o. very pleasant male patient who presents with the following:  Underwent a right C5-6 fusion about 6 years ago and has experienced no adverse effect and his pain that led to the procedure was resolved.  He now comes to the office with a complaint of pain in the right upper arm that radiates down to the elbow.  No weakness but is experiencing some tingling in the right shoulder in the c-5 dermatome.  No history of injury or overuse.  Denies other complaint.  No improvement with over the counter medications or other home remedies.   There are no active problems to display for this patient.   Past Medical History  Diagnosis Date  . Bronchitis   . Pneumonia   . GERD (gastroesophageal reflux disease)   . Allergy     Past Surgical History  Procedure Laterality Date  . Cervical spine surgery    . Hernia repair    . Spine surgery    . Neck surgery      History  Substance Use Topics  . Smoking status: Never Smoker   . Smokeless tobacco: Never Used  . Alcohol Use: No    Family History  Problem Relation Age of Onset  . Hypertension Other   . Heart failure Other   . Diverticulitis Mother     Allergies  Allergen Reactions  . Penicillins   . Amoxicillin Rash    Medication list has been reviewed and updated.  Current Outpatient Prescriptions on File Prior to Visit  Medication Sig Dispense Refill  . aspirin-acetaminophen-caffeine (EXCEDRIN MIGRAINE) 250-250-65 MG per tablet Take 2 tablets by mouth every 6 (six) hours as needed. For migraines.      . diphenhydrAMINE (BENADRYL) 25 MG tablet Take 25 mg by mouth every 6 (six) hours as needed. For allergies.      . Multiple Vitamin  (MULITIVITAMIN WITH MINERALS) TABS Take 1 tablet by mouth daily.      . Ranitidine HCl (ZANTAC PO) Take by mouth.      Marland Kitchen albuterol (PROVENTIL HFA;VENTOLIN HFA) 108 (90 BASE) MCG/ACT inhaler Inhale 2 puffs into the lungs every 6 (six) hours as needed. For shortness of breath.      . benzonatate (TESSALON) 100 MG capsule Take 1-2 capsules (100-200 mg total) by mouth 3 (three) times daily as needed for cough.  40 capsule  0  . omeprazole (PRILOSEC) 20 MG capsule Take 20 mg by mouth daily.      . predniSONE (DELTASONE) 10 MG tablet Take 4 tabs daily for 4 days, 3 tabs daily for 4 days, 2 tabs daily for 4 days, then 1 tab daily for 4 days.  40 tablet  0   No current facility-administered medications on file prior to visit.    Review of Systems:  As per HPI, otherwise negative.    Physical Examination: Filed Vitals:   03/07/13 0844  BP: 112/80  Pulse: 102  Temp: 98.8 F (37.1 C)  Resp: 18   Filed Vitals:   03/07/13 0844  Height: 5' 1.75" (1.568 m)  Weight: 124 lb 12.8 oz (56.609 kg)   Body mass index is  23.02 kg/(m^2). Ideal Body Weight: Weight in (lb) to have BMI = 25: 135.3  GEN: WDWN, NAD, Non-toxic, A & O x 3 HEENT: Atraumatic, Normocephalic. Neck supple. No masses, No LAD. Ears and Nose: No external deformity. NECK:  Tender right trapezius CV: RRR, No M/G/R. No JVD. No thrill. No extra heart sounds. PULM: CTA B, no wheezes, crackles, rhonchi. No retractions. No resp. distress. No accessory muscle use. ABD: S, NT, ND, +BS. No rebound. No HSM. EXTR: No c/c/e NEURO Normal gait. Balance and coordination. PSYCH: Normally interactive. Conversant. Not depressed or anxious appearing.  Calm demeanor.    Assessment and Plan: Cervical strain Anaprox Flexeril Follow up in one week   Signed,  Phillips Odor, MD

## 2013-03-18 ENCOUNTER — Ambulatory Visit (INDEPENDENT_AMBULATORY_CARE_PROVIDER_SITE_OTHER): Payer: 59 | Admitting: Family Medicine

## 2013-03-18 VITALS — BP 118/66 | HR 120 | Temp 98.1°F | Resp 18 | Ht 61.75 in | Wt 124.0 lb

## 2013-03-18 DIAGNOSIS — M5412 Radiculopathy, cervical region: Secondary | ICD-10-CM

## 2013-03-18 DIAGNOSIS — M5010 Cervical disc disorder with radiculopathy, unspecified cervical region: Secondary | ICD-10-CM

## 2013-03-18 MED ORDER — PREDNISONE 20 MG PO TABS: ORAL_TABLET | ORAL | Status: DC

## 2013-03-18 MED ORDER — CYCLOBENZAPRINE HCL 10 MG PO TABS: 10.0000 mg | ORAL_TABLET | Freq: Three times a day (TID) | ORAL | Status: DC | PRN

## 2013-03-18 MED ORDER — TRAMADOL HCL 50 MG PO TABS: 50.0000 mg | ORAL_TABLET | Freq: Four times a day (QID) | ORAL | Status: DC | PRN

## 2013-03-18 NOTE — Progress Notes (Signed)
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  He was seen last week by Dr. Dareen Piano for a recurrence of neck pain in right neck.  He had no h/o new trauma or activity, so he thinks he must have "slept wrong."  Pain seemed to worsen last night and he couldn't sleep.  The pain is radiating down the back side of his right hand all the way down his arm.  He doesn't have pain with movement of neck.  Prior surgery was on the left side of the neck.  He takes benadryl for allergies and zantac for GERD.  He takes Excedrin for migraines.  Objective:  NAD, alert holding right lower neck  Neck:  Good ROM, tender right lower neck Reflexes:  Diminished right triceps.  Symmetric biceps jerks Motor:  Diminished triceps strength..  Skin:  Normal  Assessment:  C6 disc disease with acute exacerbation  Plan:  Prednisone, flexeril Recheck this weekend. Cervical disc disorder with radiculopathy of cervical region - Plan: traMADol (ULTRAM) 50 MG tablet, cyclobenzaprine (FLEXERIL) 10 MG tablet, predniSONE (DELTASONE) 20 MG tablet    Signed,  Elvina Sidle, MD

## 2013-03-22 ENCOUNTER — Ambulatory Visit (INDEPENDENT_AMBULATORY_CARE_PROVIDER_SITE_OTHER): Payer: 59 | Admitting: Emergency Medicine

## 2013-03-22 VITALS — BP 118/62 | HR 111 | Temp 98.1°F | Resp 16 | Ht 61.5 in | Wt 130.0 lb

## 2013-03-22 DIAGNOSIS — M5412 Radiculopathy, cervical region: Secondary | ICD-10-CM

## 2013-03-22 DIAGNOSIS — M5010 Cervical disc disorder with radiculopathy, unspecified cervical region: Secondary | ICD-10-CM

## 2013-03-22 MED ORDER — TRAMADOL HCL 50 MG PO TABS: 50.0000 mg | ORAL_TABLET | Freq: Four times a day (QID) | ORAL | Status: DC | PRN

## 2013-03-22 MED ORDER — CYCLOBENZAPRINE HCL 10 MG PO TABS: 10.0000 mg | ORAL_TABLET | Freq: Three times a day (TID) | ORAL | Status: DC | PRN

## 2013-03-22 MED ORDER — NAPROXEN SODIUM 550 MG PO TABS: 550.0000 mg | ORAL_TABLET | Freq: Two times a day (BID) | ORAL | Status: DC

## 2013-03-22 NOTE — Patient Instructions (Signed)
Cervical Radiculopathy  Cervical radiculopathy happens when a nerve in the neck is pinched or bruised by a slipped (herniated) disk or by arthritic changes in the bones of the cervical spine. This can occur due to an injury or as part of the normal aging process. Pressure on the cervical nerves can cause pain or numbness that runs from your neck all the way down into your arm and fingers.  CAUSES   There are many possible causes, including:   Injury.   Muscle tightness in the neck from overuse.   Swollen, painful joints (arthritis).   Breakdown or degeneration in the bones and joints of the spine (spondylosis) due to aging.   Bone spurs that may develop near the cervical nerves.  SYMPTOMS   Symptoms include pain, weakness, or numbness in the affected arm and hand. Pain can be severe or irritating. Symptoms may be worse when extending or turning the neck.  DIAGNOSIS   Your caregiver will ask about your symptoms and do a physical exam. He or she may test your strength and reflexes. X-rays, CT scans, and MRI scans may be needed in cases of injury or if the symptoms do not go away after a period of time. Electromyography (EMG) or nerve conduction testing may be done to study how your nerves and muscles are working.  TREATMENT   Your caregiver may recommend certain exercises to help relieve your symptoms. Cervical radiculopathy can, and often does, get better with time and treatment. If your problems continue, treatment options may include:   Wearing a soft collar for short periods of time.   Physical therapy to strengthen the neck muscles.   Medicines, such as nonsteroidal anti-inflammatory drugs (NSAIDs), oral corticosteroids, or spinal injections.   Surgery. Different types of surgery may be done depending on the cause of your problems.  HOME CARE INSTRUCTIONS    Put ice on the affected area.   Put ice in a plastic bag.   Place a towel between your skin and the bag.    Leave the ice on for 15-20 minutes, 3-4 times a day or as directed by your caregiver.   If ice does not help, you can try using heat. Take a warm shower or bath, or use a hot water bottle as directed by your caregiver.   You may try a gentle neck and shoulder massage.   Use a flat pillow when you sleep.   Only take over-the-counter or prescription medicines for pain, discomfort, or fever as directed by your caregiver.   If physical therapy was prescribed, follow your caregiver's directions.   If a soft collar was prescribed, use it as directed.  SEEK IMMEDIATE MEDICAL CARE IF:    Your pain gets much worse and cannot be controlled with medicines.   You have weakness or numbness in your hand, arm, face, or leg.   You have a high fever or a stiff, rigid neck.   You lose bowel or bladder control (incontinence).   You have trouble with walking, balance, or speaking.  MAKE SURE YOU:    Understand these instructions.   Will watch your condition.   Will get help right away if you are not doing well or get worse.  Document Released: 02/21/2001 Document Revised: 08/21/2011 Document Reviewed: 01/10/2011  [employer] Patient Information 2014 [employer].

## 2013-03-22 NOTE — Progress Notes (Signed)
[facility] [address] [place] [address] [phone]  Date:  03/22/2013   Name:  Harry Morales   DOB:  1971/05/03   MRN:  [id number]  PCP:  No primary provider on file.    Chief Complaint: Follow-up   History of Present Illness:  Harry Morales is a 42 y.o. very pleasant male patient who presents with the following:  Follow up for cervical neuritis.  Continues to have paresthesias right arm and tingling of the thumb and index finger.  Some improvement with treatment but still symptoms are prominent.  Denies other complaint or health concern today.   There are no active problems to display for this patient.   Past Medical History  Diagnosis Date  . Bronchitis   . Pneumonia   . GERD (gastroesophageal reflux disease)   . Allergy     Past Surgical History  Procedure Laterality Date  . Cervical spine surgery    . Hernia repair    . Spine surgery    . Neck surgery      History  Substance Use Topics  . Smoking status: Never Smoker   . Smokeless tobacco: Never Used  . Alcohol Use: No    Family History  Problem Relation Age of Onset  . Hypertension Other   . Heart failure Other   . Diverticulitis Mother     Allergies  Allergen Reactions  . Penicillins   . Amoxicillin Rash    Medication list has been reviewed and updated.  Current Outpatient Prescriptions on File Prior to Visit  Medication Sig Dispense Refill  . albuterol (PROVENTIL HFA;VENTOLIN HFA) 108 (90 BASE) MCG/ACT inhaler Inhale 2 puffs into the lungs every 6 (six) hours as needed. For shortness of breath.      Marland Kitchen aspirin-acetaminophen-caffeine (EXCEDRIN MIGRAINE) 250-250-65 MG per tablet Take 2 tablets by mouth every 6 (six) hours as needed. For migraines.      . cyclobenzaprine (FLEXERIL) 10 MG tablet Take 1 tablet (10 mg total) by mouth 3 (three) times daily as needed for muscle spasms.  30 tablet  0  . diphenhydrAMINE (BENADRYL) 25 MG tablet Take 25 mg by mouth every  6 (six) hours as needed. For allergies.      . Multiple Vitamin (MULITIVITAMIN WITH MINERALS) TABS Take 1 tablet by mouth daily.      . naproxen sodium (ANAPROX DS) 550 MG tablet Take 1 tablet (550 mg total) by mouth 2 (two) times daily with a meal.  40 tablet  0  . omeprazole (PRILOSEC) 20 MG capsule Take 20 mg by mouth daily.      . predniSONE (DELTASONE) 20 MG tablet 2 daily with food  10 tablet  1  . Ranitidine HCl (ZANTAC PO) Take by mouth.      . traMADol (ULTRAM) 50 MG tablet Take 1 tablet (50 mg total) by mouth every 6 (six) hours as needed for pain.  30 tablet  0   No current facility-administered medications on file prior to visit.    Review of Systems:  As per HPI, otherwise negative.    Physical Examination: Filed Vitals:   03/22/13 0757  BP: 118/62  Pulse: 111  Temp: 98.1 F (36.7 C)  Resp: 16   Filed Vitals:   03/22/13 0757  Height: 5' 1.5" (1.562 m)  Weight: 130 lb (58.968 kg)   Body mass index is 24.17 kg/(m^2). Ideal Body Weight: Weight in (lb) to have BMI = 25: 134.2  GEN: WDWN, NAD, Non-toxic, Alert & Oriented x 3 HEENT: Atraumatic, Normocephalic.  Ears and Nose: No external deformity. EXTR: No clubbing/cyanosis/edema NEURO: Normal gait.  Weak triceps right PSYCH: Normally interactive. Conversant. Not depressed or anxious appearing.  Calm demeanor.    Assessment and Plan: Cervical radiculopathy MRI Continue meds   Signed,  Phillips Odor, MD

## 2013-03-31 ENCOUNTER — Ambulatory Visit: Payer: 59

## 2013-03-31 ENCOUNTER — Ambulatory Visit (INDEPENDENT_AMBULATORY_CARE_PROVIDER_SITE_OTHER): Payer: 59 | Admitting: Family Medicine

## 2013-03-31 VITALS — BP 100/60 | HR 116 | Temp 98.0°F | Resp 18 | Ht 63.0 in | Wt 122.0 lb

## 2013-03-31 DIAGNOSIS — M5412 Radiculopathy, cervical region: Secondary | ICD-10-CM

## 2013-03-31 DIAGNOSIS — M5010 Cervical disc disorder with radiculopathy, unspecified cervical region: Secondary | ICD-10-CM

## 2013-03-31 MED ORDER — TRAMADOL HCL 50 MG PO TABS: 50.0000 mg | ORAL_TABLET | Freq: Four times a day (QID) | ORAL | Status: DC | PRN

## 2013-03-31 MED ORDER — CYCLOBENZAPRINE HCL 10 MG PO TABS: 10.0000 mg | ORAL_TABLET | Freq: Three times a day (TID) | ORAL | Status: DC | PRN

## 2013-03-31 MED ORDER — NAPROXEN SODIUM 550 MG PO TABS: 550.0000 mg | ORAL_TABLET | Freq: Two times a day (BID) | ORAL | Status: AC

## 2013-03-31 NOTE — Progress Notes (Signed)
Subjective:    Patient ID: Harry Morales, male    DOB: 02/12/1971, 42 y.o.   MRN: [id number] Chief Complaint  Patient presents with  . Follow-up    Neck pain    HPI  His neck has been bothering him more this weekend.  He had c-spine fusion 6 yrs ago and now there is concerned for C6 radiculopathy so wants to f/u with MRI.  He has tried to make an appt with Dr. Hunt Oris with [facility] but is waiting to hear back from their office.  He also tried to see the "city doctor" but they couldn't see him today - Dr. Lucia Gaskins - he plans to see her tomorrow.  Yesterday the pain and soreness radiating through lateral right arm and tingling in right 1st finger has been worst. No longer throbbing.  The muscle relaxant, prednisone, and pain medicine have all helped a lot.  Is out of all of them.  Prednisone did help a lot  - he finished the course 5d ago and 3d ago the pain came back severely.  Is on regular duty at work but called in sick today for these visits.  We are still waiting on insurance authorization for his MRI so it has not been scheduled yet.  Past Medical History  Diagnosis Date  . Bronchitis   . Pneumonia   . GERD (gastroesophageal reflux disease)   . Allergy    Current Outpatient Prescriptions on File Prior to Visit  Medication Sig Dispense Refill  . albuterol (PROVENTIL HFA;VENTOLIN HFA) 108 (90 BASE) MCG/ACT inhaler Inhale 2 puffs into the lungs every 6 (six) hours as needed. For shortness of breath.      . diphenhydrAMINE (BENADRYL) 25 MG tablet Take 25 mg by mouth every 6 (six) hours as needed. For allergies.      . Multiple Vitamin (MULITIVITAMIN WITH MINERALS) TABS Take 1 tablet by mouth daily.      . Ranitidine HCl (ZANTAC PO) Take by mouth.      . cyclobenzaprine (FLEXERIL) 10 MG tablet Take 1 tablet (10 mg total) by mouth 3 (three) times daily as needed for muscle spasms.  30 tablet  0  . naproxen sodium (ANAPROX DS) 550 MG tablet Take 1 tablet (550 mg total) by  mouth 2 (two) times daily with a meal.  40 tablet  0  . traMADol (ULTRAM) 50 MG tablet Take 1 tablet (50 mg total) by mouth every 6 (six) hours as needed for pain.  30 tablet  0   No current facility-administered medications on file prior to visit.   Allergies  Allergen Reactions  . Penicillins   . Amoxicillin Rash     Review of Systems  Constitutional: Positive for activity change. Negative for fever, chills and appetite change.  Gastrointestinal: Negative for abdominal pain, diarrhea and constipation.  Genitourinary: Negative for urgency, frequency, decreased urine volume and difficulty urinating.  Musculoskeletal: Positive for arthralgias, back pain, myalgias, neck pain and neck stiffness. Negative for gait problem and joint swelling.  Skin: Negative for color change and wound.  Neurological: Positive for weakness and numbness. Negative for dizziness and light-headedness.      BP 100/60  Pulse 116  Temp(Src) 98 F (36.7 C) (Oral)  Resp 18  Ht 5\' 3"  (1.6 m)  Wt 122 lb (55.339 kg)  BMI 21.62 kg/m2  SpO2 99% Objective:   Physical Exam  Constitutional: He is oriented to person, place, and time. He appears well-developed and well-nourished. No  distress.  HENT:  Head: Normocephalic and atraumatic.  Eyes: No scleral icterus.  Neck: Neck supple. Muscular tenderness present. No spinous process tenderness present. No rigidity. Decreased range of motion present. No edema and no erythema present.  Pulmonary/Chest: Effort normal.  Musculoskeletal:       Cervical back: He exhibits decreased range of motion, tenderness, pain and spasm.  Neurological: He is alert and oriented to person, place, and time. He displays atrophy. He displays no tremor. A sensory deficit is present. He exhibits normal muscle tone. Gait normal.  Reflex Scores:      Tricep reflexes are 2+ on the right side and 2+ on the left side.      Bicep reflexes are 2+ on the right side and 2+ on the left side.       Brachioradialis reflexes are 2+ on the right side and 2+ on the left side. Skin: Skin is warm and dry. He is not diaphoretic.  Psychiatric: He has a normal mood and affect. His behavior is normal.  C-spine: Decreased flexion and extension, normal lateral flexion and rotation, neg Spurling's test. DTRs 2+ throughout upper ext. Nml sensation to light touch bilaterally except decreased in right index finger. Weakness on tricep and deltoid testion 4+/5 on right. nml bicep, nml grip and opposition     [facility] reading (PRIMARY) by  Dr. Clelia Croft.  cspine: c5-6 fusion hardware in place. DDD of C6-7. Assessment & Plan:   Cervical disc disorder with radiculopathy of cervical region - Plan: traMADol (ULTRAM) 50 MG tablet, cyclobenzaprine (FLEXERIL) 10 MG tablet, DG Cervical Spine Complete Got c-spine MRI scheduled today while pt was in office for Sat 10/25 - rec f/u w/ neurosurgery next wk after MRI - if he can't get appt w/ neurosurg than return here.  Cont light duty at work - minimize use of Rt hand and no lifting above 25 lbs. Meds refilled. If sxs worsen in the meantime, cons repeating course of prednisone. Meds ordered this encounter  Medications  . traMADol (ULTRAM) 50 MG tablet    Sig: Take 1 tablet (50 mg total) by mouth every 6 (six) hours as needed for pain.    Dispense:  30 tablet    Refill:  0  . cyclobenzaprine (FLEXERIL) 10 MG tablet    Sig: Take 1 tablet (10 mg total) by mouth 3 (three) times daily as needed for muscle spasms.    Dispense:  30 tablet    Refill:  0  . naproxen sodium (ANAPROX DS) 550 MG tablet    Sig: Take 1 tablet (550 mg total) by mouth 2 (two) times daily with a meal.    Dispense:  40 tablet    Refill:  0    Norberto Sorenson, MD MPH

## 2013-03-31 NOTE — Patient Instructions (Addendum)
You have your neck MRI scheduled for 10/25 at 8:45 Sat at [facility] [address].  Plan to come back here on Sun or Monday to recheck and review MRI results or you can follow-up with your neurosurgeon instead after this.  When you get your MRI, ask for a copy of the disc so you can bring it to your neurosurgeon's office.  Cervical Radiculopathy Cervical radiculopathy happens when a nerve in the neck is pinched or bruised by a slipped (herniated) disk or by arthritic changes in the bones of the cervical spine. This can occur due to an injury or as part of the normal aging process. Pressure on the cervical nerves can cause pain or numbness that runs from your neck all the way down into your arm and fingers. CAUSES  There are many possible causes, including:  Injury.  Muscle tightness in the neck from overuse.  Swollen, painful joints (arthritis).  Breakdown or degeneration in the bones and joints of the spine (spondylosis) due to aging.  Bone spurs that may develop near the cervical nerves. SYMPTOMS  Symptoms include pain, weakness, or numbness in the affected arm and hand. Pain can be severe or irritating. Symptoms may be worse when extending or turning the neck. DIAGNOSIS  Your caregiver will ask about your symptoms and do a physical exam. He or she may test your strength and reflexes. X-rays, CT scans, and MRI scans may be needed in cases of injury or if the symptoms do not go away after a period of time. Electromyography (EMG) or nerve conduction testing may be done to study how your nerves and muscles are working. TREATMENT  Your caregiver may recommend certain exercises to help relieve your symptoms. Cervical radiculopathy can, and often does, get better with time and treatment. If your problems continue, treatment options may include:  Wearing a soft collar for short periods of time.  Physical therapy to strengthen the neck muscles.  Medicines, such as nonsteroidal  anti-inflammatory drugs (NSAIDs), oral corticosteroids, or spinal injections.  Surgery. Different types of surgery may be done depending on the cause of your problems. HOME CARE INSTRUCTIONS   Put ice on the affected area.  Put ice in a plastic bag.  Place a towel between your skin and the bag.  Leave the ice on for 15-20 minutes, 3-4 times a day or as directed by your caregiver.  If ice does not help, you can try using heat. Take a warm shower or bath, or use a hot water bottle as directed by your caregiver.  You may try a gentle neck and shoulder massage.  Use a flat pillow when you sleep.  Only take over-the-counter or prescription medicines for pain, discomfort, or fever as directed by your caregiver.  If physical therapy was prescribed, follow your caregiver's directions.  If a soft collar was prescribed, use it as directed. SEEK IMMEDIATE MEDICAL CARE IF:   Your pain gets much worse and cannot be controlled with medicines.  You have weakness or numbness in your hand, arm, face, or leg.  You have a high fever or a stiff, rigid neck.  You lose bowel or bladder control (incontinence).  You have trouble with walking, balance, or speaking. MAKE SURE YOU:   Understand these instructions.  Will watch your condition.  Will get help right away if you are not doing well or get worse. Document Released: 02/21/2001 Document Revised: 08/21/2011 Document Reviewed: 01/10/2011 [facility] Patient Information 2014 [place], [place].

## 2013-04-05 ENCOUNTER — Ambulatory Visit
Admission: RE | Admit: 2013-04-05 | Discharge: 2013-04-05 | Disposition: A | Discharge status: Home / Self Care | Payer: 59 | Source: Ambulatory Visit | Attending: Emergency Medicine | Admitting: Emergency Medicine

## 2013-04-05 DIAGNOSIS — M5010 Cervical disc disorder with radiculopathy, unspecified cervical region: Secondary | ICD-10-CM

## 2013-04-06 ENCOUNTER — Other Ambulatory Visit: Payer: Self-pay | Admitting: Emergency Medicine

## 2013-04-06 DIAGNOSIS — M5010 Cervical disc disorder with radiculopathy, unspecified cervical region: Secondary | ICD-10-CM

## 2014-03-10 ENCOUNTER — Ambulatory Visit (INDEPENDENT_AMBULATORY_CARE_PROVIDER_SITE_OTHER): Payer: 59 | Admitting: Family Medicine

## 2014-03-10 VITALS — BP 104/74 | HR 95 | Temp 97.9°F | Resp 18 | Ht 63.0 in | Wt 121.4 lb

## 2014-03-10 DIAGNOSIS — J320 Chronic maxillary sinusitis: Secondary | ICD-10-CM

## 2014-03-10 MED ORDER — AZITHROMYCIN 250 MG PO TABS: ORAL_TABLET | ORAL | Status: DC

## 2014-03-10 NOTE — Progress Notes (Signed)
Patient ID: Harry Morales MRN: [id number], DOB: 09/13/1970, 43 y.o. Date of Encounter: 03/10/2014, 10:25 AM  Primary Physician: No primary provider on file.  Chief Complaint:  Chief Complaint  Patient presents with  . Sinusitis    when laying down pt cant stop coughing x 1 day. No sob. No phlegm just dry cough    HPI: 43 y.o. year old male who works for [employer] and presents with 3 day history of nasal congestion, post nasal drip, sore throat, sinus pressure, and cough. Afebrile. No chills. Nasal congestion thick and green/yellow. Sinus pressure is the worst symptom. Cough is productive secondary to post nasal drip and not associated with time of day. Ears feel full, leading to sensation of muffled hearing. Has tried OTC cold preps without success. No GI complaints.   No recent antibiotics, recent travels, vomiting, or sick contacts   No leg trauma, sedentary periods, h/o cancer, or tobacco use.  Using benadryl, delsym  Past Medical History  Diagnosis Date  . Bronchitis   . Pneumonia   . GERD (gastroesophageal reflux disease)   . Allergy      Home Meds: Prior to Admission medications   Medication Sig Start Date End Date Taking? Authorizing Provider  albuterol (PROVENTIL HFA;VENTOLIN HFA) 108 (90 BASE) MCG/ACT inhaler Inhale 2 puffs into the lungs every 6 (six) hours as needed. For shortness of breath.   Yes Historical Provider, MD  cyclobenzaprine (FLEXERIL) 10 MG tablet Take 1 tablet (10 mg total) by mouth 3 (three) times daily as needed for muscle spasms. 03/31/13  Yes Sherren MochaEva N Shaw, MD  diphenhydrAMINE (BENADRYL) 25 MG tablet Take 25 mg by mouth every 6 (six) hours as needed. For allergies.   Yes Historical Provider, MD  Multiple Vitamin (MULITIVITAMIN WITH MINERALS) TABS Take 1 tablet by mouth daily.   Yes Historical Provider, MD  naproxen sodium (ANAPROX DS) 550 MG tablet Take 1 tablet (550 mg total) by mouth 2 (two) times daily with a meal. 03/31/13 03/31/14   Sherren MochaEva N Shaw, MD  Ranitidine HCl (ZANTAC PO) Take by mouth.    Historical Provider, MD  traMADol (ULTRAM) 50 MG tablet Take 1 tablet (50 mg total) by mouth every 6 (six) hours as needed for pain. 03/31/13   Sherren MochaEva N Shaw, MD    Allergies:  Allergies  Allergen Reactions  . Penicillins   . Amoxicillin Rash    History   Social History  . Marital Status: Single    Spouse Name: N/A    Number of Children: N/A  . Years of Education: N/A   Occupational History  . Not on file.   Social History Main Topics  . Smoking status: Never Smoker   . Smokeless tobacco: Never Used  . Alcohol Use: Yes     Comment: 2 a year  . Drug Use: No  . Sexual Activity: Not on file   Other Topics Concern  . Not on file   Social History Narrative  . No narrative on file     Review of Systems: Constitutional: negative for chills, fever, night sweats or weight changes Cardiovascular: negative for chest pain or palpitations Respiratory: negative for hemoptysis, wheezing, or shortness of breath Abdominal: negative for abdominal pain, nausea, vomiting or diarrhea Dermatological: negative for rash Neurologic: negative for headache   Physical Exam: Blood pressure 104/74, pulse 95, temperature 97.9 F (36.6 C), temperature source Oral, resp. rate 18, height 5\' 3"  (1.6 m), weight 121 lb 6.4 oz (55.067 kg),  SpO2 98.00%., Body mass index is 21.51 kg/(m^2). General: Well developed, well nourished, in no acute distress. Head: Normocephalic, atraumatic, eyes without discharge, sclera non-icteric, nares are congested. Bilateral auditory canals clear, TM's are without perforation, pearly grey with reflective cone of light bilaterally. Serous effusion bilaterally behind TM's. Maxillary sinus TTP. Oral cavity moist, dentition normal. Posterior pharynx with post nasal drip and mild erythema. No peritonsillar abscess or tonsillar exudate. Neck: Supple. No thyromegaly. Full ROM. No lymphadenopathy. Lungs: Clear bilaterally  to auscultation without wheezes, rales, or rhonchi. Breathing is unlabored.  Heart: RRR with S1 S2. No murmurs, rubs, or gallops appreciated. Msk:  Strength and tone normal for age. Extremities: No clubbing or cyanosis. No edema. Neuro: Alert and oriented X 3. Moves all extremities spontaneously. CNII-XII grossly in tact. Psych:  Responds to questions appropriately with a normal affect.    ASSESSMENT AND PLAN:  43 y.o. year old male with sinusitis -Maxillary sinusitis, unspecified chronicity - Plan: azithromycin (ZITHROMAX Z-PAK) 250 MG tablet     -Rest/fluids -RTC precautions -RTC 3-5 days if no improvement  Signed, Elvina Sidle, MD 03/10/2014 10:25 AM

## 2014-03-10 NOTE — Patient Instructions (Signed)

## 2014-06-15 ENCOUNTER — Ambulatory Visit (INDEPENDENT_AMBULATORY_CARE_PROVIDER_SITE_OTHER): Payer: 59

## 2014-06-15 ENCOUNTER — Ambulatory Visit (INDEPENDENT_AMBULATORY_CARE_PROVIDER_SITE_OTHER): Payer: 59 | Admitting: Emergency Medicine

## 2014-06-15 VITALS — BP 110/62 | HR 81 | Temp 97.8°F | Resp 18 | Ht 62.5 in | Wt 126.0 lb

## 2014-06-15 DIAGNOSIS — J320 Chronic maxillary sinusitis: Secondary | ICD-10-CM

## 2014-06-15 DIAGNOSIS — J029 Acute pharyngitis, unspecified: Secondary | ICD-10-CM

## 2014-06-15 DIAGNOSIS — R05 Cough: Secondary | ICD-10-CM

## 2014-06-15 DIAGNOSIS — R059 Cough, unspecified: Secondary | ICD-10-CM

## 2014-06-15 LAB — POCT CBC
Granulocyte percent: 80 %G (ref 37–80)
HCT, POC: 47.3 % (ref 43.5–53.7)
HEMOGLOBIN: 15.9 g/dL (ref 14.1–18.1)
LYMPH, POC: 1.5 (ref 0.6–3.4)
MCH: 29.6 pg (ref 27–31.2)
MCHC: 33.7 g/dL (ref 31.8–35.4)
MCV: 88 fL (ref 80–97)
MID (cbc): 0.7 (ref 0–0.9)
MPV: 8.2 fL (ref 0–99.8)
POC Granulocyte: 8.6 — AB (ref 2–6.9)
POC LYMPH PERCENT: 13.5 %L (ref 10–50)
POC MID %: 6.5 % (ref 0–12)
Platelet Count, POC: 250 10*3/uL (ref 142–424)
RBC: 5.37 M/uL (ref 4.69–6.13)
RDW, POC: 13.3 %
WBC: 10.8 10*3/uL — AB (ref 4.6–10.2)

## 2014-06-15 LAB — POCT RAPID STREP A (OFFICE): Rapid Strep A Screen: NEGATIVE

## 2014-06-15 MED ORDER — AZITHROMYCIN 250 MG PO TABS: ORAL_TABLET | ORAL | Status: DC

## 2014-06-15 MED ORDER — BENZONATATE 100 MG PO CAPS: 100.0000 mg | ORAL_CAPSULE | Freq: Three times a day (TID) | ORAL | Status: DC | PRN

## 2014-06-15 MED ORDER — ALBUTEROL SULFATE HFA 108 (90 BASE) MCG/ACT IN AERS: INHALATION_SPRAY | RESPIRATORY_TRACT | Status: AC

## 2014-06-15 NOTE — Patient Instructions (Signed)

## 2014-06-15 NOTE — Progress Notes (Addendum)
Subjective:  This chart was scribed for Harry Lites, MD by Harry Morales, ED Scribe at [facility].The patient was seen in exam room 10 and the patient's care was started at 9:28 AM.   Patient ID: Harry Morales, male    DOB: [birth year], 44 y.o.   MRN: [id number] Chief Complaint  Patient presents with  . Cough    x5 days   . Nasal Congestion   HPI HPI Comments: Harry Morales is a 44 y.o. male who presents to [facility] complaining of a non-productive cough and nasal congestion, onset 4 days ago. Laying down worsens his cough. He has an intermittent sore throat and trouble sleeping as associated symptoms. Pt has taken mucinex, dayquil and nyquil for little relief. Pt has had sick contacts. He does not have a history of asthma. Pt has had the flu shot. He does not smoke. He denies fever and wheezing. Pt works in [employer], [employer] for the [place]. He has tubes in both ears.  There are no active problems to display for this patient.  Past Medical History  Diagnosis Date  . Bronchitis   . Pneumonia   . GERD (gastroesophageal reflux disease)   . Allergy    Past Surgical History  Procedure Laterality Date  . Cervical spine surgery    . Hernia repair    . Spine surgery    . Neck surgery     Allergies  Allergen Reactions  . Penicillins   . Amoxicillin Rash   Prior to Admission medications   Medication Sig Start Date End Date Taking? Authorizing Provider  albuterol (PROVENTIL HFA;VENTOLIN HFA) 108 (90 BASE) MCG/ACT inhaler Inhale 2 puffs into the lungs every 6 (six) hours as needed. For shortness of breath.   Yes Historical Provider, MD  cyclobenzaprine (FLEXERIL) 10 MG tablet Take 1 tablet (10 mg total) by mouth 3 (three) times daily as needed for muscle spasms. 03/31/13  Yes Sherren Mocha, MD  diphenhydrAMINE (BENADRYL) 25 MG tablet Take 25 mg by mouth every 6 (six) hours as needed. For allergies.   Yes Historical Provider, MD  Multiple Vitamin (MULITIVITAMIN WITH  MINERALS) TABS Take 1 tablet by mouth daily.   Yes Historical Provider, MD  Pseudoephedrine-APAP-DM (DAYQUIL PO) Take by mouth.   Yes Historical Provider, MD  Pseudoephedrine-Guaifenesin [facility] D PO) Take by mouth.   Yes Historical Provider, MD  traMADol (ULTRAM) 50 MG tablet Take 1 tablet (50 mg total) by mouth every 6 (six) hours as needed for pain. 03/31/13  Yes Sherren Mocha, MD  azithromycin (ZITHROMAX Z-PAK) 250 MG tablet Take as directed on pack Patient not taking: Reported on 06/15/2014 03/10/14   Elvina Sidle, MD  Ranitidine HCl (ZANTAC PO) Take by mouth.    Historical Provider, MD   History   Social History  . Marital Status: Single    Spouse Name: N/A    Number of Children: N/A  . Years of Education: N/A   Occupational History  . Not on file.   Social History Main Topics  . Smoking status: Never Smoker   . Smokeless tobacco: Never Used  . Alcohol Use: Yes     Comment: 2 a year  . Drug Use: No  . Sexual Activity: Not on file   Other Topics Concern  . Not on file   Social History Narrative   Review of Systems  Constitutional: Negative for fever.  HENT: Positive for congestion.   Respiratory: Positive for cough. Negative  for wheezing.   Psychiatric/Behavioral: Positive for sleep disturbance.      Objective:  BP 110/62 mmHg  Pulse 81  Temp(Src) 97.8 F (36.6 C) (Oral)  Resp 18  Ht 5' 2.5" (1.588 m)  Wt 126 lb (57.153 kg)  BMI 22.66 kg/m2  SpO2 99%  Physical Exam  Constitutional: He is oriented to person, place, and time. He appears well-developed and well-nourished.  HENT:  Head: Normocephalic and atraumatic.  Scarring of both ear drums. Throat is red.  Eyes: EOM are normal.  Neck: Normal range of motion.  Cardiovascular: Normal rate.   Pulmonary/Chest: Effort normal and breath sounds normal. He has no wheezes. He has no rales.  Lungs sound clear.  Musculoskeletal: Normal range of motion.  Neurological: He is alert and oriented to person, place, and  time.  Skin: Skin is warm and dry.  Psychiatric: He has a normal mood and affect. His behavior is normal.  Nursing note and vitals reviewed.  Results for orders placed or performed in visit on 06/15/14  POCT CBC  Result Value Ref Range   WBC 10.8 (A) 4.6 - 10.2 K/uL   Lymph, poc 1.5 0.6 - 3.4   POC LYMPH PERCENT 13.5 10 - 50 %L   MID (cbc) 0.7 0 - 0.9   POC MID % 6.5 0 - 12 %M   POC Granulocyte 8.6 (A) 2 - 6.9   Granulocyte percent 80.0 37 - 80 %G   RBC 5.37 4.69 - 6.13 M/uL   Hemoglobin 15.9 14.1 - 18.1 g/dL   HCT, POC 16.1 09.6 - 53.7 %   MCV 88.0 80 - 97 fL   MCH, POC 29.6 27 - 31.2 pg   MCHC 33.7 31.8 - 35.4 g/dL   RDW, POC 04.5 %   Platelet Count, POC 250 142 - 424 K/uL   MPV 8.2 0 - 99.8 fL  POCT rapid strep A  Result Value Ref Range   Rapid Strep A Screen Negative Negative  [facility] reading (PRIMARY) by  Dr.Daub there are no definite pneumonic infiltrates heart size normal no pneumothorax     Assessment & Plan:  White count is somewhat elevated. We'll treat with Tessalon Perles and Z-Pak. He was given a note out of work until Thursday. I did refill his albuterol inhaler to have if he feels any wheezing in his chest. I personally performed the services described in this documentation, which was scribed in my presence. The recorded information has been reviewed and is accurate.I personally performed the services described in this documentation, which was scribed in my presence. The recorded information has been reviewed and is accurate.

## 2015-04-08 ENCOUNTER — Ambulatory Visit (INDEPENDENT_AMBULATORY_CARE_PROVIDER_SITE_OTHER): Payer: 59 | Admitting: Family Medicine

## 2015-04-08 VITALS — BP 102/60 | HR 88 | Temp 98.2°F | Resp 16 | Ht 62.5 in | Wt 129.0 lb

## 2015-04-08 DIAGNOSIS — R05 Cough: Secondary | ICD-10-CM | POA: Diagnosis not present

## 2015-04-08 DIAGNOSIS — R059 Cough, unspecified: Secondary | ICD-10-CM

## 2015-04-08 DIAGNOSIS — J3089 Other allergic rhinitis: Secondary | ICD-10-CM

## 2015-04-08 MED ORDER — BENZONATATE 100 MG PO CAPS: 100.0000 mg | ORAL_CAPSULE | Freq: Three times a day (TID) | ORAL | Status: DC | PRN

## 2015-04-08 MED ORDER — FLUTICASONE PROPIONATE 50 MCG/ACT NA SUSP: 2.0000 | Freq: Every day | NASAL | Status: DC

## 2015-04-08 NOTE — Patient Instructions (Signed)
Upper Respiratory Infection, Adult Most upper respiratory infections (URIs) are a viral infection of the air passages leading to the lungs. A URI affects the nose, throat, and upper air passages. The most common type of URI is nasopharyngitis and is typically referred to as "the common cold." URIs run their course and usually go away on their own. Most of the time, a URI does not require medical attention, but sometimes a bacterial infection in the upper airways can follow a viral infection. This is called a secondary infection. Sinus and middle ear infections are common types of secondary upper respiratory infections. Bacterial pneumonia can also complicate a URI. A URI can worsen asthma and chronic obstructive pulmonary disease (COPD). Sometimes, these complications can require emergency medical care and may be life threatening.  CAUSES Almost all URIs are caused by viruses. A virus is a type of germ and can spread from one person to another.  RISKS FACTORS You may be at risk for a URI if:   You smoke.   You have chronic heart or lung disease.  You have a weakened defense (immune) system.   You are very young or very old.   You have nasal allergies or asthma.  You work in crowded or poorly ventilated areas.  You work in health care facilities or schools. SIGNS AND SYMPTOMS  Symptoms typically develop 2-3 days after you come in contact with a cold virus. Most viral URIs last 7-10 days. However, viral URIs from the influenza virus (flu virus) can last 14-18 days and are typically more severe. Symptoms may include:   Runny or stuffy (congested) nose.   Sneezing.   Cough.   Sore throat.   Headache.   Fatigue.   Fever.   Loss of appetite.   Pain in your forehead, behind your eyes, and over your cheekbones (sinus pain).  Muscle aches.  DIAGNOSIS  Your health care provider may diagnose a URI by:  Physical exam.  Tests to check that your symptoms are not due to  another condition such as:  Strep throat.  Sinusitis.  Pneumonia.  Asthma. TREATMENT  A URI goes away on its own with time. It cannot be cured with medicines, but medicines may be prescribed or recommended to relieve symptoms. Medicines may help:  Reduce your fever.  Reduce your cough.  Relieve nasal congestion. HOME CARE INSTRUCTIONS   Take medicines only as directed by your health care provider.   Gargle warm saltwater or take cough drops to comfort your throat as directed by your health care provider.  Use a warm mist humidifier or inhale steam from a shower to increase air moisture. This may make it easier to breathe.  Drink enough fluid to keep your urine clear or pale yellow.   Eat soups and other clear broths and maintain good nutrition.   Rest as needed.   Return to work when your temperature has returned to normal or as your health care provider advises. You may need to stay home longer to avoid infecting others. You can also use a face mask and careful hand washing to prevent spread of the virus.  Increase the usage of your inhaler if you have asthma.   Do not use any tobacco products, including cigarettes, chewing tobacco, or electronic cigarettes. If you need help quitting, ask your health care provider. PREVENTION  The best way to protect yourself from getting a cold is to practice good hygiene.   Avoid oral or hand contact with people with cold   symptoms.   Wash your hands often if contact occurs.  There is no clear evidence that vitamin C, vitamin E, echinacea, or exercise reduces the chance of developing a cold. However, it is always recommended to get plenty of rest, exercise, and practice good nutrition.  SEEK MEDICAL CARE IF:   You are getting worse rather than better.   Your symptoms are not controlled by medicine.   You have chills.  You have worsening shortness of breath.  You have brown or red mucus.  You have yellow or brown nasal  discharge.  You have pain in your face, especially when you bend forward.  You have a fever.  You have swollen neck glands.  You have pain while swallowing.  You have white areas in the back of your throat. SEEK IMMEDIATE MEDICAL CARE IF:   You have severe or persistent:  Headache.  Ear pain.  Sinus pain.  Chest pain.  You have chronic lung disease and any of the following:  Wheezing.  Prolonged cough.  Coughing up blood.  A change in your usual mucus.  You have a stiff neck.  You have changes in your:  Vision.  Hearing.  Thinking.  Mood. MAKE SURE YOU:   Understand these instructions.  Will watch your condition.  Will get help right away if you are not doing well or get worse.   This information is not intended to replace advice given to you by your health care provider. Make sure you discuss any questions you have with your health care provider.   Document Released: 11/22/2000 Document Revised: 10/13/2014 Document Reviewed: 09/03/2013 Elsevier Interactive Patient Education 2016 Elsevier Inc.  

## 2015-04-08 NOTE — Progress Notes (Signed)
   Subjective:    Patient ID: Harry Morales, male    DOB: 05/22/1971, 44 y.o.   MRN: [id number]  HPI This is pleasant 44 yo male who presents today with non productive, "scratchy "cough, mild nasal congestion.  Has taken some allegra and mucinex fast act without improvement. Took delsym last night and got sick on his stomach. No vomiting. Thinks this was because he took it with his allegra. Has a history of seasonal allergies and frequent sinus infections. He works outside with the [employer]. Takes allegra every day. Has used tessalon pearls with good relief. Slept well last night.  Has used flonase in the past with good relief of allergy symptoms.   Past Medical History  Diagnosis Date  . Bronchitis   . Pneumonia   . GERD (gastroesophageal reflux disease)   . Allergy    Past Surgical History  Procedure Laterality Date  . Cervical spine surgery    . Hernia repair    . Spine surgery    . Neck surgery     Family History  Problem Relation Age of Onset  . Hypertension Other   . Heart failure Other   . Diverticulitis Mother    Social History  Substance Use Topics  . Smoking status: Never Smoker   . Smokeless tobacco: Never Used  . Alcohol Use: Yes     Comment: 2 a year    Review of Systems No fever, no chills, scratchy sore throat, no wheezing or SOB. No head ache. No myalgias.     Objective:   Physical Exam  Constitutional: He appears well-developed and well-nourished.  HENT:  Head: Normocephalic and atraumatic.  Right Ear: External ear normal. Tympanic membrane is scarred.  Left Ear: External ear normal. Tympanic membrane is scarred.  Nose: Rhinorrhea present. No mucosal edema. Right sinus exhibits no maxillary sinus tenderness and no frontal sinus tenderness. Left sinus exhibits no maxillary sinus tenderness and no frontal sinus tenderness.  Mouth/Throat: Uvula is midline and mucous membranes are normal. Posterior oropharyngeal erythema present. No  oropharyngeal exudate or posterior oropharyngeal edema.  Vitals reviewed.   BP 102/60 mmHg  Pulse 88  Temp(Src) 98.2 F (36.8 C) (Oral)  Resp 16  Ht 5' 2.5" (1.588 m)  Wt 129 lb (58.514 kg)  BMI 23.20 kg/m2  SpO2 98% Wt Readings from Last 3 Encounters:  04/08/15 129 lb (58.514 kg)  06/15/14 126 lb (57.153 kg)  03/10/14 121 lb 6.4 oz (55.067 kg)      Assessment & Plan:  1. Other allergic rhinitis - Provided written and verbal information regarding diagnosis and treatment. - encouraged daily flonase and allegra use in fall, can also use saline nasal spray - fluticasone (FLONASE) 50 MCG/ACT nasal spray; Place 2 sprays into both nostrils daily.  Dispense: 16 g; Refill: 6  2. Cough - benzonatate (TESSALON) 100 MG capsule; Take 1-2 capsules (100-200 mg total) by mouth 3 (three) times daily as needed for cough.  Dispense: 40 capsule; Refill: 0  - RTC precautions- fever/chills, increased cough, SOB, severe headache  Olean Reeeborah , FNP-BC  [facility]  04/09/2015 8:26 AM

## 2015-06-11 ENCOUNTER — Ambulatory Visit (INDEPENDENT_AMBULATORY_CARE_PROVIDER_SITE_OTHER): Payer: Commercial Managed Care - HMO | Admitting: Family Medicine

## 2015-06-11 VITALS — BP 104/70 | HR 73 | Temp 98.6°F | Resp 18 | Ht 62.25 in | Wt 132.4 lb

## 2015-06-11 DIAGNOSIS — R059 Cough, unspecified: Secondary | ICD-10-CM

## 2015-06-11 DIAGNOSIS — R05 Cough: Secondary | ICD-10-CM

## 2015-06-11 DIAGNOSIS — J302 Other seasonal allergic rhinitis: Secondary | ICD-10-CM | POA: Diagnosis not present

## 2015-06-11 DIAGNOSIS — J320 Chronic maxillary sinusitis: Secondary | ICD-10-CM

## 2015-06-11 MED ORDER — HYDROCODONE-HOMATROPINE 5-1.5 MG/5ML PO SYRP: 5.0000 mL | ORAL_SOLUTION | Freq: Every evening | ORAL | Status: DC | PRN

## 2015-06-11 MED ORDER — BENZONATATE 100 MG PO CAPS: 100.0000 mg | ORAL_CAPSULE | Freq: Three times a day (TID) | ORAL | Status: DC | PRN

## 2015-06-11 MED ORDER — AZITHROMYCIN 250 MG PO TABS: ORAL_TABLET | ORAL | Status: DC

## 2015-06-11 NOTE — Patient Instructions (Signed)

## 2015-06-11 NOTE — Progress Notes (Signed)
Chief Complaint:  Chief Complaint  Patient presents with  . Cough  . Nasal Congestion  . Chest congestion    HPI: Harry Morales is a 44 y.o. male who reports to [facility] today complaining of 4-5 day history of sinus congestion, minimal to no facial pain, had HA in frontal area, had yellow productive cough. He has had sick contact.  He takes Careers adviser and also flonase , delsyn and mucinex. No fevers, chills. No ear pain. Some diarrhea. No SOB or CP or wheezing   Past Medical History  Diagnosis Date  . Bronchitis   . Pneumonia   . GERD (gastroesophageal reflux disease)   . Allergy    Past Surgical History  Procedure Laterality Date  . Cervical spine surgery    . Hernia repair    . Spine surgery    . Neck surgery     Social History   Social History  . Marital Status: Single    Spouse Name: N/A  . Number of Children: N/A  . Years of Education: N/A   Social History Main Topics  . Smoking status: Never Smoker   . Smokeless tobacco: Never Used  . Alcohol Use: Yes     Comment: 2 a year  . Drug Use: No  . Sexual Activity: Not Asked   Other Topics Concern  . None   Social History Narrative   Family History  Problem Relation Age of Onset  . Hypertension Other   . Heart failure Other   . Diverticulitis Mother    Allergies  Allergen Reactions  . Penicillins   . Amoxicillin Rash   Prior to Admission medications   Medication Sig Start Date End Date Taking? Authorizing Provider  Dextromethorphan-Menthol (DELSYM COUGH RELIEF MT) Use as directed in the mouth or throat.   Yes Historical Provider, MD  fexofenadine (ALLEGRA) 180 MG tablet Take 180 mg by mouth daily.   Yes Historical Provider, MD  fluticasone (FLONASE) 50 MCG/ACT nasal spray Place 2 sprays into both nostrils daily. 04/08/15  Yes Emi Belfast, FNP  GuaiFENesin (MUCINEX PO) Take by mouth.   Yes Historical Provider, MD  albuterol (PROVENTIL HFA;VENTOLIN HFA) 108 (90 BASE) MCG/ACT inhaler 2 puffs  every 4-6 hours as needed for chest tightness or wheezing. Patient not taking: Reported on 06/11/2015 06/15/14   Collene Gobble, MD  benzonatate (TESSALON) 100 MG capsule Take 1-2 capsules (100-200 mg total) by mouth 3 (three) times daily as needed for cough. Patient not taking: Reported on 06/11/2015 04/08/15   Emi Belfast, FNP  Multiple Vitamin (MULITIVITAMIN WITH MINERALS) TABS Take 1 tablet by mouth daily. Reported on 06/11/2015    Historical Provider, MD  Pseudoephedrine-Guaifenesin [facility] D PO) Take by mouth. Reported on 06/11/2015    Historical Provider, MD     ROS: The patient denies fevers, chills, night sweats, unintentional weight loss, chest pain, palpitations, wheezing, dyspnea on exertion, nausea, vomiting, abdominal pain, dysuria, hematuria, melena, numbness, weakness, or tingling.   All other systems have been reviewed and were otherwise negative with the exception of those mentioned in the HPI and as above.    PHYSICAL EXAM: Filed Vitals:   06/11/15 0849  BP: 104/70  Pulse: 73  Temp: 98.6 F (37 C)  Resp: 18   Body mass index is 24.03 kg/(m^2).   General: Alert, no acute distress HEENT:  Normocephalic, atraumatic, oropharynx patent. EOMI, PERRLA Erythematous throat, no exudates, TM normal, + sinus tenderness, + erythematous/boggy nasal mucosa Cardiovascular:  Regular rate and rhythm, no rubs murmurs or gallops.  No Carotid bruits, radial pulse intact. No pedal edema.  Respiratory: Clear to auscultation bilaterally.  No wheezes, rales, or rhonchi.  No cyanosis, no use of accessory musculature Abdominal: No organomegaly, abdomen is soft and non-tender, positive bowel sounds. No masses. Skin: No rashes. Neurologic: Facial musculature symmetric. Psychiatric: Patient acts appropriately throughout our interaction. Lymphatic: No cervical or submandibular lymphadenopathy Musculoskeletal: Gait intact. No edema, tenderness   LABS:    EKG/XRAY:   Primary read  interpret[facility].   ASSESSMENT/PLAN: Encounter Diagnoses  Name Primary?  . Maxillary sinusitis, unspecified chronicity Yes  . Cough   . Seasonal allergies    Rx azithromycin Rx Tessalon perles, hycodan  Fu prn , cont with allergy meds prn   Gross sideeffects, risk and benefits, and alternatives of medications d/w patient. Patient is aware that all medications have potential sideeffects and we are unable to predict every sideeffect or drug-drug interaction that may occur.    DO  06/11/2015 9:56 AM

## 2016-05-30 ENCOUNTER — Ambulatory Visit (INDEPENDENT_AMBULATORY_CARE_PROVIDER_SITE_OTHER): Payer: Commercial Managed Care - HMO | Admitting: Physician Assistant

## 2016-05-30 VITALS — BP 110/70 | HR 94 | Temp 98.0°F | Resp 18 | Ht 62.25 in | Wt 126.0 lb

## 2016-05-30 DIAGNOSIS — R05 Cough: Secondary | ICD-10-CM

## 2016-05-30 DIAGNOSIS — J019 Acute sinusitis, unspecified: Secondary | ICD-10-CM | POA: Diagnosis not present

## 2016-05-30 DIAGNOSIS — J3489 Other specified disorders of nose and nasal sinuses: Secondary | ICD-10-CM

## 2016-05-30 DIAGNOSIS — R059 Cough, unspecified: Secondary | ICD-10-CM

## 2016-05-30 LAB — POCT CBC
Granulocyte percent: 90.3 % — AB (ref 37–80)
HCT, POC: 45.9 % (ref 43.5–53.7)
Hemoglobin: 15.9 g/dL (ref 14.1–18.1)
Lymph, poc: 1.4 (ref 0.6–3.4)
MCH, POC: 30 pg (ref 27–31.2)
MCHC: 34.6 g/dL (ref 31.8–35.4)
MCV: 86.6 fL (ref 80–97)
MID (cbc): 0.2 (ref 0–0.9)
MPV: 9.2 fL (ref 0–99.8)
POC Granulocyte: 15.1 — AB (ref 2–6.9)
POC LYMPH PERCENT: 8.6 % — AB (ref 10–50)
POC MID %: 1.1 %M (ref 0–12)
Platelet Count, POC: 196 10*3/uL (ref 142–424)
RBC: 5.3 M/uL (ref 4.69–6.13)
RDW, POC: 13.3 %
WBC: 16.7 10*3/uL — AB (ref 4.6–10.2)

## 2016-05-30 MED ORDER — BENZONATATE 100 MG PO CAPS: 100.0000 mg | ORAL_CAPSULE | Freq: Three times a day (TID) | ORAL | 0 refills | Status: DC | PRN

## 2016-05-30 MED ORDER — DOXYCYCLINE HYCLATE 100 MG PO TABS: 200.0000 mg | ORAL_TABLET | Freq: Two times a day (BID) | ORAL | 0 refills | Status: AC

## 2016-05-30 MED ORDER — HYDROCODONE-HOMATROPINE 5-1.5 MG/5ML PO SYRP: 5.0000 mL | ORAL_SOLUTION | Freq: Every evening | ORAL | 0 refills | Status: DC | PRN

## 2016-05-30 MED ORDER — MUCINEX DM MAXIMUM STRENGTH 60-1200 MG PO TB12: 1.0000 | ORAL_TABLET | Freq: Two times a day (BID) | ORAL | 1 refills | Status: DC

## 2016-05-30 NOTE — Patient Instructions (Addendum)
Please stay well hydrated. Drink at least 2 L water/day, hot teas, soups, etc. Get plenty of rest. Please take ENTIRE COURSE of antibiotics, even if you start feeling better.  If you are not better in 5-7 days come back to the clinic.   Thank you for coming in today. I hope you feel we met your needs.  Feel free to call [facility] if you have any questions or further requests.  Please consider signing up for MyChart if you do not already have it, as this is a great way to communicate with me.  Best,  Whitney McVey, PA-C   IF you received an x-ray today, you will receive an invoice from [facility]. Please contact [facility] at [phone] with questions or concerns regarding your invoice.   IF you received labwork today, you will receive an invoice from [facility]. Please contact [facility] at [phone] with questions or concerns regarding your invoice.   Our billing staff will not be able to assist you with questions regarding bills from these companies.  You will be contacted with the lab results as soon as they are available. The fastest way to get your results is to activate your My Chart account. Instructions are located on the last page of this paperwork. If you have not heard from [place] regarding the results in 2 weeks, please contact this office.

## 2016-05-30 NOTE — Progress Notes (Signed)
Harry BreedingMichael T Morales  MRN: [id number] DOB: 08/29/1970  PCP: No PCP Per Patient  Subjective:  Pt is a pleasant 45 year old male who presents to clinic for sinus pressure, cough and headache.  Cough started two days ago, is non-productive. Every time he coughs he feels a lot of pressure in sinuses, "like my head it full". His cough is keeping him up at night. Reports sweats at night last night. +headache, sinus pressure, sinus pain.  He has been taking Benadryl, Mucinex. Not working Denies SOB, chest pain, chest pressure, wheezing, sore throat, runny nose, sneezing, fatigue.  Has had his flu shot this year.  Review of Systems  Constitutional: Positive for chills. Negative for diaphoresis and fever.  HENT: Positive for sinus pain and sinus pressure. Negative for congestion, ear pain, postnasal drip, rhinorrhea and sore throat.   Respiratory: Positive for cough. Negative for chest tightness, shortness of breath and wheezing.   Cardiovascular: Negative for chest pain and palpitations.  Gastrointestinal: Negative for diarrhea, nausea and vomiting.  Neurological: Positive for headaches. Negative for dizziness, syncope and light-headedness.  Psychiatric/Behavioral: Positive for sleep disturbance. The patient is not nervous/anxious.     There are no active problems to display for this patient.   Current Outpatient Prescriptions on File Prior to Visit  Medication Sig Dispense Refill  . albuterol (PROVENTIL HFA;VENTOLIN HFA) 108 (90 BASE) MCG/ACT inhaler 2 puffs every 4-6 hours as needed for chest tightness or wheezing. 1 Inhaler 1  . Multiple Vitamin (MULITIVITAMIN WITH MINERALS) TABS Take 1 tablet by mouth daily. Reported on 06/11/2015    . Pseudoephedrine-Guaifenesin (MUCINEX D PO) Take by mouth. Reported on 06/11/2015     No current facility-administered medications on file prior to visit.     Allergies  Allergen Reactions  . Penicillins   . Amoxicillin Rash     Objective:  BP 110/70  (BP Location: Right Arm, Patient Position: Sitting, Cuff Size: Small)   Pulse 94   Temp 98 F (36.7 C) (Oral)   Resp 18   Ht 5' 2.25" (1.581 m)   Wt 126 lb (57.2 kg)   SpO2 99%   BMI 22.86 kg/m   Physical Exam  Constitutional: He is oriented to person, place, and time and well-developed, well-nourished, and in no distress. No distress.  HENT:  Right Ear: Tympanic membrane and ear canal normal.  Left Ear: Tympanic membrane and ear canal normal.  Nose: Right sinus exhibits maxillary sinus tenderness and frontal sinus tenderness. Left sinus exhibits maxillary sinus tenderness and frontal sinus tenderness.  Mouth/Throat: Mucous membranes are normal. Posterior oropharyngeal edema present. No oropharyngeal exudate or posterior oropharyngeal erythema.  Cardiovascular: Normal rate, regular rhythm and normal heart sounds.   Pulmonary/Chest: Effort normal. He has no decreased breath sounds. He has no wheezes. He has no rhonchi. He has no rales.  Neurological: He is alert and oriented to person, place, and time. GCS score is 15.  Skin: Skin is warm and dry.  Psychiatric: Mood, memory, affect and judgment normal.  Vitals reviewed.  Results for orders placed or performed in visit on 05/30/16  POCT CBC  Result Value Ref Range   WBC 16.7 (A) 4.6 - 10.2 K/uL   Lymph, poc 1.4 0.6 - 3.4   POC LYMPH PERCENT 8.6 (A) 10 - 50 %L   MID (cbc) 0.2 0 - 0.9   POC MID % 1.1 0 - 12 %M   POC Granulocyte 15.1 (A) 2 - 6.9   Granulocyte percent 90.3 (A)  37 - 80 %G   RBC 5.30 4.69 - 6.13 M/uL   Hemoglobin 15.9 14.1 - 18.1 g/dL   HCT, POC 81.145.9 91.443.5 - 53.7 %   MCV 86.6 80 - 97 fL   MCH, POC 30.0 27 - 31.2 pg   MCHC 34.6 31.8 - 35.4 g/dL   RDW, POC 78.213.3 %   Platelet Count, POC 196 142 - 424 K/uL   MPV 9.2 0 - 99.8 fL    Assessment and Plan :  1. Acute non-recurrent sinusitis, unspecified location 2. Cough 3. Sinus pressure - doxycycline (VIBRA-TABS) 100 MG tablet; Take 2 tablets (200 mg total) by mouth  2 (two) times daily.  Dispense: 28 tablet; Refill: 0 - POCT CBC - benzonatate (TESSALON) 100 MG capsule; Take 1-2 capsules (100-200 mg total) by mouth 3 (three) times daily as needed for cough.  Dispense: 40 capsule; Refill: 0 - HYDROcodone-homatropine (HYCODAN) 5-1.5 MG/5ML syrup; Take 5 mLs by mouth at bedtime as needed.  Dispense: 120 mL; Refill: 0 - Dextromethorphan-Guaifenesin (MUCINEX DM MAXIMUM STRENGTH) 60-1200 MG TB12; Take 1 tablet by mouth every 12 (twelve) hours.  Dispense: 14 each; Refill: 1 - Supportive care: Push fluids and rest. RTC in 5-7 days if no improvement. He understands and agrees with plan.   Marco CollieWhitney , PA-C  [facility] 05/30/2016 10:14 AM

## 2016-06-01 ENCOUNTER — Encounter: Payer: Self-pay | Admitting: Family Medicine

## 2016-06-01 ENCOUNTER — Telehealth: Payer: Self-pay

## 2016-06-01 ENCOUNTER — Ambulatory Visit (INDEPENDENT_AMBULATORY_CARE_PROVIDER_SITE_OTHER): Payer: Commercial Managed Care - HMO | Admitting: Family Medicine

## 2016-06-01 VITALS — BP 104/64 | HR 90 | Temp 98.0°F | Resp 16 | Ht 62.25 in | Wt 127.0 lb

## 2016-06-01 DIAGNOSIS — T45525D Adverse effect of antithrombotic drugs, subsequent encounter: Secondary | ICD-10-CM | POA: Diagnosis not present

## 2016-06-01 DIAGNOSIS — R110 Nausea: Secondary | ICD-10-CM | POA: Diagnosis not present

## 2016-06-01 DIAGNOSIS — J329 Chronic sinusitis, unspecified: Secondary | ICD-10-CM | POA: Diagnosis not present

## 2016-06-01 DIAGNOSIS — D72829 Elevated white blood cell count, unspecified: Secondary | ICD-10-CM

## 2016-06-01 DIAGNOSIS — R05 Cough: Secondary | ICD-10-CM | POA: Diagnosis not present

## 2016-06-01 DIAGNOSIS — J310 Chronic rhinitis: Secondary | ICD-10-CM

## 2016-06-01 DIAGNOSIS — R059 Cough, unspecified: Secondary | ICD-10-CM

## 2016-06-01 LAB — POCT CBC
Granulocyte percent: 82.7 %G — AB (ref 37–80)
HCT, POC: 42.1 % — AB (ref 43.5–53.7)
HEMOGLOBIN: 14.7 g/dL (ref 14.1–18.1)
Lymph, poc: 1.5 (ref 0.6–3.4)
MCH: 29.9 pg (ref 27–31.2)
MCHC: 34.8 g/dL (ref 31.8–35.4)
MCV: 85.8 fL (ref 80–97)
MID (cbc): 0.3 (ref 0–0.9)
MPV: 9.2 fL (ref 0–99.8)
PLATELET COUNT, POC: 251 10*3/uL (ref 142–424)
POC Granulocyte: 8.7 — AB (ref 2–6.9)
POC LYMPH PERCENT: 14.4 %L (ref 10–50)
POC MID %: 2.9 %M (ref 0–12)
RBC: 4.91 M/uL (ref 4.69–6.13)
RDW, POC: 13.3 %
WBC: 10.5 10*3/uL — AB (ref 4.6–10.2)

## 2016-06-01 MED ORDER — AZITHROMYCIN 250 MG PO TABS: ORAL_TABLET | ORAL | 0 refills | Status: DC

## 2016-06-01 NOTE — Progress Notes (Signed)
Patient ID: Harry Morales, male    DOB: 05/12/1971  Age: 45 y.o. MRN: [id number]  Chief Complaint  Patient presents with  . Headache    Pt states that he symptoms became worse when he started the antibiotic given 12/19  . Facial Pain  . Nausea  . Emesis    Subjective:   45 year old man who was treated here couple of days ago with doxycycline for a sinusitis and cough infection. He had an elevated white count of over 16,000 at that time. He was intolerant of the doxycycline that was given him, causing him GI side effects. He discontinued it today and came in for reassessment and change of medicine. He has a history of penicillin allergies causing a rash. He has done well on azithromycin in the past.  Current allergies, medications, problem list, past/family and social histories reviewed.  Objective:  BP 104/64   Pulse 90   Temp 98 F (36.7 C) (Oral)   Resp 16   Ht 5' 2.25" (1.581 m)   Wt 127 lb (57.6 kg)   SpO2 98%   BMI 23.04 kg/m   Still coughing. No fever. TMs normal. Throat clear. Neck supple without significant nodes. Chest is clear to auscultation. Mild tenderness in sinuses.  Assessment & Plan:   Assessment: 1. Leukocytosis, unspecified type   2. Cough   3. Rhinosinusitis   4. Nausea without vomiting   5. Adverse effect of antithrombotic medication, subsequent encounter       Plan: See instructions  Orders Placed This Encounter  Procedures  . POCT CBC    Meds ordered this encounter  Medications  . azithromycin (ZITHROMAX) 250 MG tablet    Sig: Take 2 tabs PO x 1 dose, then 1 tab PO QD x 4 days    Dispense:  6 tablet    Refill:  0         Patient Instructions   Drink plenty of fluids and get enough rest. Take the rest of the week off so you can rest. Avoid smoky dusty environments.  Continue your cough medications  Discontinue the doxycycline  Begin azithromycin (Z-Pak) 2 pills initially, then 1 daily for 4 days  Return if further  problems    IF you received an x-ray today, you will receive an invoice from [facility]. Please contact [facility] at [phone] with questions or concerns regarding your invoice.   IF you received labwork today, you will receive an invoice from [facility]. Please contact [facility] at [phone] with questions or concerns regarding your invoice.   Our billing staff will not be able to assist you with questions regarding bills from these companies.  You will be contacted with the lab results as soon as they are available. The fastest way to get your results is to activate your My Chart account. Instructions are located on the last page of this paperwork. If you have not heard from us regarding the results in 2 weeks, please contact this office.       Results for orders placed or performed in visit on 06/01/16  POCT CBC  Result Value Ref Range   WBC 10.5 (A) 4.6 - 10.2 K/uL   Lymph, poc 1.5 0.6 - 3.4   POC LYMPH PERCENT 14.4 10 - 50 %L   MID (cbc) 0.3 0 - 0.9   POC MID % 2.9 0 - 12 %M   POC Granulocyte 8.7 (A) 2 - 6.9   Granulocyte percent 82.7 (A) 37 - 80 %  G   RBC 4.91 4.69 - 6.13 M/uL   Hemoglobin 14.7 14.1 - 18.1 g/dL   HCT, POC 13.042.1 (A) 86.543.5 - 53.7 %   MCV 85.8 80 - 97 fL   MCH, POC 29.9 27 - 31.2 pg   MCHC 34.8 31.8 - 35.4 g/dL   RDW, POC 78.413.3 %   Platelet Count, POC 251 142 - 424 K/uL   MPV 9.2 0 - 99.8 fL      Return if symptoms worsen or fail to improve.   HOPPER,DAVID, MD 06/01/2016

## 2016-06-01 NOTE — Telephone Encounter (Signed)
ERROR--pt is coming into office

## 2016-06-01 NOTE — Patient Instructions (Addendum)
Drink plenty of fluids and get enough rest. Take the rest of the week off so you can rest. Avoid smoky dusty environments.  Continue your cough medications  Discontinue the doxycycline  Begin azithromycin (Z-Pak) 2 pills initially, then 1 daily for 4 days  Return if further problems    IF you received an x-ray today, you will receive an invoice from [facility]. Please contact [facility] at [phone] with questions or concerns regarding your invoice.   IF you received labwork today, you will receive an invoice from [facility]. Please contact [facility] at [phone] with questions or concerns regarding your invoice.   Our billing staff will not be able to assist you with questions regarding bills from these companies.  You will be contacted with the lab results as soon as they are available. The fastest way to get your results is to activate your My Chart account. Instructions are located on the last page of this paperwork. If you have not heard from us regarding the results in 2 weeks, please contact this office.

## 2017-03-06 ENCOUNTER — Other Ambulatory Visit: Payer: Self-pay | Admitting: Family Medicine

## 2017-03-06 DIAGNOSIS — J3089 Other allergic rhinitis: Secondary | ICD-10-CM

## 2017-05-27 DIAGNOSIS — J22 Unspecified acute lower respiratory infection: Secondary | ICD-10-CM | POA: Diagnosis not present

## 2017-05-27 DIAGNOSIS — B9689 Other specified bacterial agents as the cause of diseases classified elsewhere: Secondary | ICD-10-CM | POA: Diagnosis not present

## 2017-05-27 DIAGNOSIS — J019 Acute sinusitis, unspecified: Secondary | ICD-10-CM | POA: Diagnosis not present

## 2018-03-04 ENCOUNTER — Other Ambulatory Visit: Payer: Self-pay | Admitting: Nurse Practitioner

## 2018-03-04 ENCOUNTER — Ambulatory Visit
Admission: RE | Admit: 2018-03-04 | Discharge: 2018-03-04 | Disposition: A | Discharge status: Home / Self Care | Payer: 59 | Source: Ambulatory Visit | Attending: Nurse Practitioner | Admitting: Nurse Practitioner

## 2018-03-04 DIAGNOSIS — R609 Edema, unspecified: Secondary | ICD-10-CM

## 2018-03-04 DIAGNOSIS — M25562 Pain in left knee: Secondary | ICD-10-CM

## 2018-03-04 DIAGNOSIS — M1712 Unilateral primary osteoarthritis, left knee: Secondary | ICD-10-CM | POA: Diagnosis not present

## 2018-05-18 ENCOUNTER — Other Ambulatory Visit: Payer: Self-pay

## 2018-05-18 ENCOUNTER — Encounter: Payer: Self-pay | Admitting: Family Medicine

## 2018-05-18 ENCOUNTER — Ambulatory Visit: Payer: 59 | Admitting: Family Medicine

## 2018-05-18 VITALS — BP 111/71 | HR 80 | Temp 97.5°F | Resp 16 | Ht 63.0 in | Wt 133.0 lb

## 2018-05-18 DIAGNOSIS — R05 Cough: Secondary | ICD-10-CM | POA: Diagnosis not present

## 2018-05-18 DIAGNOSIS — R519 Headache, unspecified: Secondary | ICD-10-CM | POA: Insufficient documentation

## 2018-05-18 DIAGNOSIS — R0981 Nasal congestion: Secondary | ICD-10-CM

## 2018-05-18 DIAGNOSIS — R059 Cough, unspecified: Secondary | ICD-10-CM

## 2018-05-18 DIAGNOSIS — R51 Headache: Secondary | ICD-10-CM

## 2018-05-18 LAB — POCT INFLUENZA A/B
INFLUENZA B, POC: NEGATIVE
Influenza A, POC: NEGATIVE

## 2018-05-18 MED ORDER — CETIRIZINE HCL 10 MG PO TABS: 10.0000 mg | ORAL_TABLET | Freq: Every day | ORAL | 11 refills | Status: DC

## 2018-05-18 MED ORDER — PREDNISONE 20 MG PO TABS: 40.0000 mg | ORAL_TABLET | Freq: Every day | ORAL | 0 refills | Status: DC

## 2018-05-18 MED ORDER — BENZONATATE 100 MG PO CAPS: 100.0000 mg | ORAL_CAPSULE | Freq: Three times a day (TID) | ORAL | 0 refills | Status: DC | PRN

## 2018-05-18 NOTE — Patient Instructions (Addendum)
If you have lab work done today you will be contacted with your lab results within the next 2 weeks.  If you have not heard from [place] then please contact us. The fastest way to get your results is to register for My Chart.   IF you received an x-ray today, you will receive an invoice from [facility]. Please contact [facility] at [phone] with questions or concerns regarding your invoice.   IF you received labwork today, you will receive an invoice from [facility]. Please contact [facility] at [phone] with questions or concerns regarding your invoice.   Our billing staff will not be able to assist you with questions regarding bills from these companies.  You will be contacted with the lab results as soon as they are available. The fastest way to get your results is to activate your My Chart account. Instructions are located on the last page of this paperwork. If you have not heard from [place] regarding the results in 2 weeks, please contact this office.     Hydrate well with water. Diarrhea will likely resolve with improvement of symptoms.   Viral Illness, Adult Viruses are tiny germs that can get into a person's body and cause illness. There are many different types of viruses, and they cause many types of illness. Viral illnesses can range from mild to severe. They can affect various parts of the body. Common illnesses that are caused by a virus include colds and the flu. Viral illnesses also include serious conditions such as HIV/AIDS (human immunodeficiency virus/acquired immunodeficiency syndrome). A few viruses have been linked to certain cancers. What are the causes? Many types of viruses can cause illness. Viruses invade cells in your body, multiply, and cause the infected cells to malfunction or die. When the cell dies, it releases more of the virus. When this happens, you develop symptoms of the illness, and the virus continues to spread to other cells. If the  virus takes over the function of the cell, it can cause the cell to divide and grow out of control, as is the case when a virus causes cancer. Different viruses get into the body in different ways. You can get a virus by:  Swallowing food or water that is contaminated with the virus.  Breathing in droplets that have been coughed or sneezed into the air by an infected person.  Touching a surface that has been contaminated with the virus and then touching your eyes, nose, or mouth.  Being bitten by an insect or animal that carries the virus.  Having sexual contact with a person who is infected with the virus.  Being exposed to blood or fluids that contain the virus, either through an open cut or during a transfusion.  If a virus enters your body, your body's defense system (immune system) will try to fight the virus. You may be at higher risk for a viral illness if your immune system is weak. What are the signs or symptoms? Symptoms vary depending on the type of virus and the location of the cells that it invades. Common symptoms of the main types of viral illnesses include: Cold and flu viruses  Fever.  Headache.  Sore throat.  Muscle aches.  Nasal congestion.  Cough. Digestive system (gastrointestinal) viruses  Fever.  Abdominal pain.  Nausea.  Diarrhea. Liver viruses (hepatitis)  Loss of appetite.  Tiredness.  Yellowing of the skin (jaundice). Brain and spinal cord viruses  Fever.  Headache.  Stiff neck.  Nausea and vomiting.  Confusion or sleepiness. Skin viruses  Warts.  Itching.  Rash. Sexually transmitted viruses  Discharge.  Swelling.  Redness.  Rash. How is this treated? Viruses can be difficult to treat because they live within cells. Antibiotic medicines do not treat viruses because these drugs do not get inside cells. Treatment for a viral illness may include:  Resting and drinking plenty of fluids.  Medicines to relieve  symptoms. These can include over-the-counter medicine for pain and fever, medicines for cough or congestion, and medicines to relieve diarrhea.  Antiviral medicines. These drugs are available only for certain types of viruses. They may help reduce flu symptoms if taken early. There are also many antiviral medicines for hepatitis and HIV/AIDS.  Some viral illnesses can be prevented with vaccinations. A common example is the flu shot. Follow these instructions at home: Medicines   Take over-the-counter and prescription medicines only as told by your health care provider.  If you were prescribed an antiviral medicine, take it as told by your health care provider. Do not stop taking the medicine even if you start to feel better.  Be aware of when antibiotics are needed and when they are not needed. Antibiotics do not treat viruses. If your health care provider thinks that you may have a bacterial infection as well as a viral infection, you may get an antibiotic. ? Do not ask for an antibiotic prescription if you have been diagnosed with a viral illness. That will not make your illness go away faster. ? Frequently taking antibiotics when they are not needed can lead to antibiotic resistance. When this develops, the medicine no longer works against the bacteria that it normally fights. General instructions  Drink enough fluids to keep your urine clear or pale yellow.  Rest as much as possible.  Return to your normal activities as told by your health care provider. Ask your health care provider what activities are safe for you.  Keep all follow-up visits as told by your health care provider. This is important. How is this prevented? Take these actions to reduce your risk of viral infection:  Eat a healthy diet and get enough rest.  Wash your hands often with soap and water. This is especially important when you are in public places. If soap and water are not available, use hand  sanitizer.  Avoid close contact with friends and family who have a viral illness.  If you travel to areas where viral gastrointestinal infection is common, avoid drinking water or eating raw food.  Keep your immunizations up to date. Get a flu shot every year as told by your health care provider.  Do not share toothbrushes, nail clippers, razors, or needles with other people.  Always practice safe sex.  Contact a health care provider if:  You have symptoms of a viral illness that do not go away.  Your symptoms come back after going away.  Your symptoms get worse. Get help right away if:  You have trouble breathing.  You have a severe headache or a stiff neck.  You have severe vomiting or abdominal pain. This information is not intended to replace advice given to you by your health care provider. Make sure you discuss any questions you have with your health care provider. Document Released: 10/08/2015 Document Revised: 11/10/2015 Document Reviewed: 10/08/2015 Elsevier Interactive Patient Education  [employer]2018 [employer] Inc[employer]

## 2018-05-18 NOTE — Progress Notes (Signed)
Patient ID: Harry BreedingMichael T Burbridge, male    DOB: 12/05/1970, 47 y.o.   MRN: [id number]  PCP: Patient, No Pcp Per  Chief Complaint  Patient presents with  . Fatigue    with bodyaches and some Diarrhea  . Cough    with nasal drainage and congestion     Subjective:  HPI Harry Morales is a 47 y.o. male presents for evaluation for cough, nasal drainage, diarrhea, and abdominal upset. Symptoms present for 1 day. Complains of nasal drainage, dry throat, dry cough, and headache. Nyquil has not improved symptoms. No fever. Has episodes of chills. 1 episode of loose stools. No known sick contacts. Received flu shot this year. Needs a work note for missed work today. Social History   Socioeconomic History  . Marital status: Single    Spouse name: Not on file  . Number of children: Not on file  . Years of education: Not on file  . Highest education level: Not on file  Occupational History  . Not on file  Social Needs  . Financial resource strain: Not on file  . Food insecurity:    Worry: Not on file    Inability: Not on file  . Transportation needs:    Medical: Not on file    Non-medical: Not on file  Tobacco Use  . Smoking status: Never Smoker  . Smokeless tobacco: Never Used  Substance and Sexual Activity  . Alcohol use: Yes    Comment: 2 a year  . Drug use: No  . Sexual activity: Not on file  Lifestyle  . Physical activity:    Days per week: Not on file    Minutes per session: Not on file  . Stress: Not on file  Relationships  . Social connections:    Talks on phone: Not on file    Gets together: Not on file    Attends religious service: Not on file    Active member of club or organization: Not on file    Attends meetings of clubs or organizations: Not on file    Relationship status: Not on file  . Intimate partner violence:    Fear of current or ex partner: Not on file    Emotionally abused: Not on file    Physically abused: Not on file    Forced sexual activity: Not  on file  Other Topics Concern  . Not on file  Social History Narrative  . Not on file    Family History  Problem Relation Age of Onset  . Diverticulitis Mother   . Hypertension Other   . Heart failure Other      Review of Systems  There are no active problems to display for this patient.   Allergies  Allergen Reactions  . Penicillins   . Amoxicillin Rash    Prior to Admission medications   Medication Sig Start Date End Date Taking? Authorizing Provider  albuterol (PROVENTIL HFA;VENTOLIN HFA) 108 (90 BASE) MCG/ACT inhaler 2 puffs every 4-6 hours as needed for chest tightness or wheezing. 06/15/14  Yes Collene Gobbleaub, Steven A, MD  Multiple Vitamin (MULITIVITAMIN WITH MINERALS) TABS Take 1 tablet by mouth daily. Reported on 06/11/2015   Yes [provider]    Past Medical, Surgical Family and Social History reviewed and updated.    Objective:   Today's Vitals   05/18/18 0807  BP: 111/71  Pulse: 80  Resp: 16  Temp: (!) 97.5 F (36.4 C)  TempSrc: Oral  SpO2: 97%  Weight:  133 lb (60.3 kg)  Height: 5\' 3"  (1.6 m)    Wt Readings from Last 3 Encounters:  05/18/18 133 lb (60.3 kg)  06/01/16 127 lb (57.6 kg)  05/30/16 126 lb (57.2 kg)     Physical Exam General appearance: alert, well developed, well nourished, cooperative and in no distress Head: Normocephalic, without obvious abnormality, atraumatic Respiratory: Respirations even and unlabored, normal respiratory rate Heart: rate and rhythm normal. No gallop or murmurs noted on exam  Abdomen: soft, BS active, no tenderness or mass palpable  Extremities: No gross deformities Skin: Skin color, texture, turgor normal. No rashes seen  Psych: Appropriate mood and affect. Neurologic: Mental status: Alert, oriented to person, place, and time, thought content appropriate.   Assessment & Plan:  1. Sinus Congestion  2. Cough 3. Nonintractable headache, unspecified chronicity pattern, unspecified headache  type Influenza negative Start prednisone 40 mg once daily x 5 days. Start cetirizine 10 mg once aily Start benzonatate 100-200 mg TID as needed    -The patient was given clear instructions to go to ER or return to medical center if symptoms do not improve, worsen or new problems develop. The patient verbalized understanding.     Godfrey Pick. Tiburcio Pea, FNP-C Nurse Practitioner (PRN Staff)  Primary [facility] at [facility] [address] [place], [place]  [phone]

## 2018-06-12 DIAGNOSIS — K859 Acute pancreatitis without necrosis or infection, unspecified: Secondary | ICD-10-CM

## 2018-06-12 DIAGNOSIS — K8020 Calculus of gallbladder without cholecystitis without obstruction: Secondary | ICD-10-CM

## 2018-06-12 HISTORY — DX: Acute pancreatitis without necrosis or infection, unspecified: K85.90

## 2018-06-12 HISTORY — DX: Calculus of gallbladder without cholecystitis without obstruction: K80.20

## 2018-07-11 ENCOUNTER — Other Ambulatory Visit: Payer: Self-pay

## 2018-07-11 ENCOUNTER — Encounter (HOSPITAL_COMMUNITY): Payer: Self-pay | Admitting: Emergency Medicine

## 2018-07-11 ENCOUNTER — Emergency Department (HOSPITAL_COMMUNITY): Payer: 59

## 2018-07-11 ENCOUNTER — Inpatient Hospital Stay (HOSPITAL_COMMUNITY)
Admission: EM | Admit: 2018-07-11 | Discharge: 2018-07-16 | DRG: 418 | Disposition: A | Discharge status: Home / Self Care | Payer: 59 | Attending: Internal Medicine | Admitting: Internal Medicine

## 2018-07-11 ENCOUNTER — Ambulatory Visit (INDEPENDENT_AMBULATORY_CARE_PROVIDER_SITE_OTHER)
Admission: EM | Admit: 2018-07-11 | Discharge: 2018-07-11 | Disposition: A | Discharge status: Home / Self Care | Payer: 59 | Source: Home / Self Care

## 2018-07-11 DIAGNOSIS — Z419 Encounter for procedure for purposes other than remedying health state, unspecified: Secondary | ICD-10-CM

## 2018-07-11 DIAGNOSIS — K219 Gastro-esophageal reflux disease without esophagitis: Secondary | ICD-10-CM | POA: Diagnosis present

## 2018-07-11 DIAGNOSIS — Z880 Allergy status to penicillin: Secondary | ICD-10-CM

## 2018-07-11 DIAGNOSIS — I160 Hypertensive urgency: Secondary | ICD-10-CM | POA: Diagnosis present

## 2018-07-11 DIAGNOSIS — Z881 Allergy status to other antibiotic agents status: Secondary | ICD-10-CM

## 2018-07-11 DIAGNOSIS — D72829 Elevated white blood cell count, unspecified: Secondary | ICD-10-CM | POA: Diagnosis not present

## 2018-07-11 DIAGNOSIS — K851 Biliary acute pancreatitis without necrosis or infection: Principal | ICD-10-CM | POA: Diagnosis present

## 2018-07-11 DIAGNOSIS — K859 Acute pancreatitis without necrosis or infection, unspecified: Secondary | ICD-10-CM | POA: Diagnosis present

## 2018-07-11 DIAGNOSIS — K8500 Idiopathic acute pancreatitis without necrosis or infection: Secondary | ICD-10-CM

## 2018-07-11 DIAGNOSIS — K8080 Other cholelithiasis without obstruction: Secondary | ICD-10-CM

## 2018-07-11 DIAGNOSIS — R1013 Epigastric pain: Secondary | ICD-10-CM

## 2018-07-11 DIAGNOSIS — N179 Acute kidney failure, unspecified: Secondary | ICD-10-CM | POA: Diagnosis present

## 2018-07-11 DIAGNOSIS — K8010 Calculus of gallbladder with chronic cholecystitis without obstruction: Secondary | ICD-10-CM | POA: Diagnosis present

## 2018-07-11 LAB — I-STAT TROPONIN, ED: Troponin i, poc: 0.01 ng/mL (ref 0.00–0.08)

## 2018-07-11 LAB — CBC
HEMATOCRIT: 56.7 % — AB (ref 39.0–52.0)
HEMOGLOBIN: 18.9 g/dL — AB (ref 13.0–17.0)
MCH: 28.5 pg (ref 26.0–34.0)
MCHC: 33.3 g/dL (ref 30.0–36.0)
MCV: 85.4 fL (ref 80.0–100.0)
Platelets: 339 10*3/uL (ref 150–400)
RBC: 6.64 MIL/uL — ABNORMAL HIGH (ref 4.22–5.81)
RDW: 13 % (ref 11.5–15.5)
WBC: 17.7 10*3/uL — ABNORMAL HIGH (ref 4.0–10.5)
nRBC: 0 % (ref 0.0–0.2)

## 2018-07-11 LAB — BASIC METABOLIC PANEL
Anion gap: 12 (ref 5–15)
BUN: 10 mg/dL (ref 6–20)
CO2: 22 mmol/L (ref 22–32)
Calcium: 10.1 mg/dL (ref 8.9–10.3)
Chloride: 103 mmol/L (ref 98–111)
Creatinine, Ser: 1.31 mg/dL — ABNORMAL HIGH (ref 0.61–1.24)
GFR calc Af Amer: >60 mL/min (ref >60)
GLUCOSE: 140 mg/dL — AB (ref 70–99)
POTASSIUM: 4 mmol/L (ref 3.5–5.1)
Sodium: 137 mmol/L (ref 135–145)

## 2018-07-11 LAB — LIPASE, BLOOD: Lipase: 597 U/L — ABNORMAL HIGH (ref 11–51)

## 2018-07-11 LAB — HEPATIC FUNCTION PANEL
ALK PHOS: 92 U/L (ref 38–126)
ALT: 251 U/L — AB (ref 0–44)
AST: 235 U/L — AB (ref 15–41)
Albumin: 4.6 g/dL (ref 3.5–5.0)
BILIRUBIN INDIRECT: 1.2 mg/dL — AB (ref 0.3–0.9)
BILIRUBIN TOTAL: 1.9 mg/dL — AB (ref 0.3–1.2)
Bilirubin, Direct: 0.7 mg/dL — ABNORMAL HIGH (ref 0.0–0.2)
Total Protein: 7.5 g/dL (ref 6.5–8.1)

## 2018-07-11 MED ORDER — SODIUM CHLORIDE 0.9 % IV SOLN
INTRAVENOUS | Status: AC
  Administered 2018-07-12 (×2): via INTRAVENOUS

## 2018-07-11 MED ORDER — SODIUM CHLORIDE 0.9 % IV BOLUS
1000.0000 mL | Freq: Once | INTRAVENOUS | Status: AC
  Administered 2018-07-11: 1000 mL via INTRAVENOUS

## 2018-07-11 MED ORDER — ACETAMINOPHEN 325 MG PO TABS
650.0000 mg | ORAL_TABLET | Freq: Four times a day (QID) | ORAL | Status: DC | PRN
  Administered 2018-07-12 – 2018-07-14 (×3): 650 mg via ORAL
  Filled 2018-07-11 (×3): qty 2

## 2018-07-11 MED ORDER — MORPHINE SULFATE (PF) 4 MG/ML IV SOLN
4.0000 mg | Freq: Once | INTRAVENOUS | Status: AC
  Administered 2018-07-11: 4 mg via INTRAVENOUS
  Filled 2018-07-11: qty 1

## 2018-07-11 MED ORDER — ONDANSETRON HCL 4 MG/2ML IJ SOLN
4.0000 mg | Freq: Four times a day (QID) | INTRAMUSCULAR | Status: DC | PRN
  Administered 2018-07-13 – 2018-07-14 (×4): 4 mg via INTRAVENOUS
  Filled 2018-07-11 (×4): qty 2

## 2018-07-11 MED ORDER — ALUM & MAG HYDROXIDE-SIMETH 200-200-20 MG/5ML PO SUSP
30.0000 mL | Freq: Once | ORAL | Status: AC
  Administered 2018-07-11: 30 mL via ORAL
  Filled 2018-07-11: qty 30

## 2018-07-11 MED ORDER — HYDROMORPHONE HCL 1 MG/ML IJ SOLN
0.5000 mg | INTRAMUSCULAR | Status: DC | PRN
  Administered 2018-07-12 – 2018-07-15 (×19): 1 mg via INTRAVENOUS
  Filled 2018-07-11 (×20): qty 1

## 2018-07-11 MED ORDER — ONDANSETRON HCL 4 MG/2ML IJ SOLN
4.0000 mg | Freq: Once | INTRAMUSCULAR | Status: AC
  Administered 2018-07-11: 4 mg via INTRAVENOUS
  Filled 2018-07-11: qty 2

## 2018-07-11 MED ORDER — SODIUM CHLORIDE 0.9% FLUSH
3.0000 mL | Freq: Once | INTRAVENOUS | Status: AC
  Administered 2018-07-11: 3 mL via INTRAVENOUS

## 2018-07-11 MED ORDER — FENTANYL CITRATE (PF) 100 MCG/2ML IJ SOLN: 25.0000 ug | INTRAMUSCULAR | Status: DC | PRN

## 2018-07-11 MED ORDER — ACETAMINOPHEN 650 MG RE SUPP: 650.0000 mg | Freq: Four times a day (QID) | RECTAL | Status: DC | PRN

## 2018-07-11 MED ORDER — HYDRALAZINE HCL 20 MG/ML IJ SOLN: 10.0000 mg | INTRAMUSCULAR | Status: DC | PRN

## 2018-07-11 MED ORDER — ENOXAPARIN SODIUM 40 MG/0.4ML ~~LOC~~ SOLN
40.0000 mg | Freq: Every day | SUBCUTANEOUS | Status: DC
  Administered 2018-07-12 – 2018-07-14 (×3): 40 mg via SUBCUTANEOUS
  Filled 2018-07-11 (×3): qty 0.4

## 2018-07-11 NOTE — ED Triage Notes (Signed)
Pt c/o epigastric pain. He states he has taken 2 doses to Pepto and Tums with no relief. Pt states he thought pain was heart burn. Pt states pain radiates form chest/epigastric to his back.

## 2018-07-11 NOTE — ED Notes (Signed)
Harry Morales, [place] and dr Delton See in in-take treatment room

## 2018-07-11 NOTE — ED Triage Notes (Signed)
Epigastric pain that started later this morning.  Took pepto at work and started vomiting.  Patient continues to have pain.  Last bm was yesterday and normal for patient.  Patient does admit to a small amount of diarrhea

## 2018-07-11 NOTE — H&P (Signed)
History and Physical    Harry Morales LNL:[id number] DOB: [birth year] DOA: 07/11/2018  PCP: [facility]   Patient coming from: Home   Chief Complaint: Epigastric pain, N/V   HPI: Harry Morales is a 48 y.o. male who denies any significant past medical history and reports that he typically enjoys good health, now presenting to the emergency department for acute onset of epigastric pain with nausea and nonbloody vomiting this morning.  Patient reports developing severe pain shortly after waking up this morning, had not eaten or drinking anything prior to this, tried Pepto-Bismol x2, as well as Tums, but did not have any relief from this.  He also reports some chills.  Denies cough, shortness of breath, chest pain, or headache.  He has never experienced these symptoms previously.  Reports only very rare alcohol use and none recently.  Reports that he does not take any medications.  ED Course: Upon arrival to the ED, patient is found to be afebrile, saturating well on room air, slightly tachycardic, and with blood pressure 180/100.  EKG features sinus tachycardia with rate 123 and nonspecific ST abnormality.  Chest x-ray is negative for acute cardiopulmonary disease.  Chemistry panel is notable for stable creatinine at 1.31, newly elevated transaminases in the 200 range, and total bilirubin of 1.9.  CBC is notable for leukocytosis of 17,700 and polycythemia with hemoglobin 18.9.  Troponin is normal and lipase is elevated to 597.  CT of the abdomen and pelvis is concerning for acute pancreatitis with pancreatic and peripancreatic inflammatory changes, as well as cholelithiasis without acute cholecystitis.  Patient was given a liter of normal saline, morphine, and Zofran in the ED.  He will be observed for further evaluation and management.  Review of Systems:  All other systems reviewed and apart from HPI, are negative.  Past Medical History:  Diagnosis Date  . Allergy   .  Bronchitis   . GERD (gastroesophageal reflux disease)   . Pneumonia     Past Surgical History:  Procedure Laterality Date  . CERVICAL SPINE SURGERY    . HERNIA REPAIR    . NECK SURGERY    . SPINE SURGERY       reports that he has never smoked. He has never used smokeless tobacco. He reports current alcohol use. He reports that he does not use drugs.  Allergies  Allergen Reactions  . Amoxicillin Rash  . Penicillins Rash    Family History  Problem Relation Age of Onset  . Diverticulitis Mother   . Hypertension Other   . Heart failure Other      Prior to Admission medications   Medication Sig Start Date End Date Taking? Authorizing Provider  albuterol (PROVENTIL HFA;VENTOLIN HFA) 108 (90 BASE) MCG/ACT inhaler 2 puffs every 4-6 hours as needed for chest tightness or wheezing. Patient taking differently: Inhale 2 puffs into the lungs every 6 (six) hours as needed for wheezing.  06/15/14  Yes Collene Gobble, MD    Physical Exam: Vitals:   07/11/18 1633 07/11/18 1641 07/11/18 1945 07/11/18 2028  BP:  (!) 150/114 (!) 161/113   Pulse:  (!) 118 (!) 108   Resp:  18 (!) 21   Temp:  (!) 97.5 F (36.4 C)    TempSrc:  Oral    SpO2:  100% 97%   Weight: 59 kg   59 kg  Height:    5\' 6"  (1.676 m)    Constitutional: NAD, appears uncomfortable  Eyes: PERTLA, lids and  conjunctivae normal ENMT: Mucous membranes are moist. Posterior pharynx clear of any exudate or lesions.   Neck: normal, supple, no masses, no thyromegaly Respiratory: clear to auscultation bilaterally, no wheezing, no crackles. Normal respiratory effort.    Cardiovascular: S1 & S2 heard, regular rate and rhythm. No extremity edema.   Abdomen: No distension, soft, tender in epigastrium. Bowel sounds active.  Musculoskeletal: no clubbing / cyanosis. No joint deformity upper and lower extremities.  Skin: no significant rashes, lesions, ulcers. Warm, dry, well-perfused. Neurologic: CN 2-12 grossly intact. Sensation  intact, DTR normal. Strength 5/5 in all 4 limbs.  Psychiatric: Alert and oriented x 3. Calm, cooperative.    Labs on Admission: I have personally reviewed following labs and imaging studies  CBC: Recent Labs  Lab 07/11/18 1655  WBC 17.7*  HGB 18.9*  HCT 56.7*  MCV 85.4  PLT 339   Basic Metabolic Panel: Recent Labs  Lab 07/11/18 1655  NA 137  K 4.0  CL 103  CO2 22  GLUCOSE 140*  BUN 10  CREATININE 1.31*  CALCIUM 10.1   GFR: Estimated Creatinine Clearance: 57.5 mL/min (A) (by C-G formula based on SCr of 1.31 mg/dL (H)). Liver Function Tests: Recent Labs  Lab 07/11/18 2058  AST 235*  ALT 251*  ALKPHOS 92  BILITOT 1.9*  PROT 7.5  ALBUMIN 4.6   Recent Labs  Lab 07/11/18 2058  LIPASE 597*   No results for input(s): AMMONIA in the last 168 hours. Coagulation Profile: No results for input(s): INR, PROTIME in the last 168 hours. Cardiac Enzymes: No results for input(s): CKTOTAL, CKMB, CKMBINDEX, TROPONINI in the last 168 hours. BNP (last 3 results) No results for input(s): PROBNP in the last 8760 hours. HbA1C: No results for input(s): HGBA1C in the last 72 hours. CBG: No results for input(s): GLUCAP in the last 168 hours. Lipid Profile: No results for input(s): CHOL, HDL, LDLCALC, TRIG, CHOLHDL, LDLDIRECT in the last 72 hours. Thyroid Function Tests: No results for input(s): TSH, T4TOTAL, FREET4, T3FREE, THYROIDAB in the last 72 hours. Anemia Panel: No results for input(s): VITAMINB12, FOLATE, FERRITIN, TIBC, IRON, RETICCTPCT in the last 72 hours. Urine analysis:    Component Value Date/Time   COLORURINE YELLOW 12/04/2007 0107   APPEARANCEUR CLEAR 12/04/2007 0107   LABSPEC 1.021 12/04/2007 0107   PHURINE 6.0 12/04/2007 0107   GLUCOSEU NEGATIVE 12/04/2007 0107   HGBUR NEGATIVE 12/04/2007 0107   BILIRUBINUR NEGATIVE 12/04/2007 0107   KETONESUR NEGATIVE 12/04/2007 0107   PROTEINUR NEGATIVE 12/04/2007 0107   UROBILINOGEN 0.2 12/04/2007 0107   NITRITE  NEGATIVE 12/04/2007 0107   LEUKOCYTESUR  12/04/2007 0107    NEGATIVE MICROSCOPIC NOT DONE ON URINES WITH NEGATIVE PROTEIN, BLOOD, LEUKOCYTES, NITRITE, OR GLUCOSE <1000 mg/dL.   Sepsis Labs: @LABRCNTIP (procalcitonin:4,lacticidven:4) )No results found for this or any previous visit (from the past 240 hour(s)).   Radiological Exams on Admission: Ct Abdomen Pelvis Wo Contrast  Result Date: 07/11/2018 CLINICAL DATA:  Epigastric pain. EXAM: CT ABDOMEN AND PELVIS WITHOUT CONTRAST TECHNIQUE: Multidetector CT imaging of the abdomen and pelvis was performed following the standard protocol without IV contrast. COMPARISON:  Abdominal ultrasound examination from today. FINDINGS: Lower chest: The lung bases are clear of an acute process. Minimal dependent subpleural atelectasis. The heart is normal in size. No pericardial effusion. Hepatobiliary: No focal hepatic lesions or intrahepatic biliary dilatation without contrast. Gallbladder is mildly distended and there are several gallstones noted. No significant pericholecystic inflammatory change or fluid. No common bile duct dilatation. Pancreas: The pancreas  is inflamed/edematous and there is significant surrounding fluid and likely inflammatory phlegmon consistent with acute pancreatitis. Moderate fluid noted in the anterior pararenal spaces bilaterally. Spleen: Normal size.  No focal lesions. Adrenals/Urinary Tract: The adrenal glands and kidneys are unremarkable. No renal, ureteral or bladder calculi. Stomach/Bowel: The stomach is unremarkable. There is moderate inflammation surrounding the second and third portions of the duodenum. No findings for obstruction. Contrast gets into the distal small bowel. The small bowel and colon are grossly normal. Vascular/Lymphatic: The aorta is normal in caliber. No atheroscerlotic calcifications. No mesenteric of retroperitoneal mass or adenopathy. Small scattered lymph nodes are noted. Reproductive: The prostate gland and  seminal vesicles are unremarkable. Other: Moderate free pelvic fluid. No pelvic adenopathy. No inguinal adenopathy or hernia. Musculoskeletal: No significant bony findings. IMPRESSION: 1. Acute pancreatitis with moderate pancreatic and peripancreatic inflammatory changes and free fluid in the anterior pararenal spaces and also in the pelvis. Without contrast no comment can be made about pancreatic necrosis. 2. Cholelithiasis without definite CT findings for acute cholecystitis. 3. Inflammation of the second and third portions of the duodenum due to the pancreatitis. Electronically Signed   By: Rudie Meyer M.D.   On: 07/11/2018 21:34   Dg Chest 2 View  Result Date: 07/11/2018 CLINICAL DATA:  Chest pain EXAM: CHEST - 2 VIEW COMPARISON:  06/15/2014 FINDINGS: The heart size and mediastinal contours are within normal limits. Both lungs are clear. The visualized skeletal structures are unremarkable. IMPRESSION: No active cardiopulmonary disease. Electronically Signed   By: Deatra Robinson M.D.   On: 07/11/2018 17:57   US Abdomen Limited  Result Date: 07/11/2018 CLINICAL DATA:  Right upper quadrant abdominal pain with nausea and vomiting. EXAM: ULTRASOUND ABDOMEN LIMITED RIGHT UPPER QUADRANT COMPARISON:  None. FINDINGS: Gallbladder: Numerous gallstones are noted the gallbladder. The largest calculus measures 1.4 cm. No gallbladder wall thickening or pericholecystic fluid. Equivocal sonographic Murphy sign. Common bile duct: Diameter: 3.6 mm Liver: Normal echogenicity without focal lesion or biliary dilatation. Portal vein is patent on color Doppler imaging with normal direction of blood flow towards the liver. IMPRESSION: 1. Cholelithiasis without sonographic findings for acute cholecystitis other than an equivocal sonographic Murphy sign. 2. Normal caliber common bile duct and normal liver. Electronically Signed   By: Rudie Meyer M.D.   On: 07/11/2018 20:53    EKG: Independently reviewed. Sinus tachycardia  (rate 123), non-specific ST-abnormality.   Assessment/Plan   1. Acute gallstone pancreatitis  - Presents with acute-onset epigastric pain and N/V  - Found to have elevated lipase and CT-findings consistent with acute pancreatitis  - AST 235, ALT 251, and total bili 1.9 - There is cholecystitis on CT and [place] with normal caliber CBD - He only rarely uses EtOH and is not taking any medications  - Likely secondary to gallstones  - Continue IVF hydration, bowel rest, pain-control    2. Hypertensive urgency  - BP 180/100 in ED without hx of HTN  - Likely secondary to pain and N/V  - Continue pain-control, as-needed antiemetics, and use IV hydralazine prn persistent elevations   3. Leukocytosis  - WBC is 17,700 on admission  - No fever, likely reactive to #1 and with component of hemoconcentration given Hgb 18.9  - Culture if febrile, repeat CBC with diff in am     DVT prophylaxis: Lovenox  Code Status: Full  Family Communication: Discussed with patient  Consults called: none Admission status: observation     Briscoe Deutscher, MD [facility] Pager [phone]  If 7PM-7AM, please contact night-coverage [email or web address] Password [email or web address]  07/11/2018, 11:12 PM

## 2018-07-11 NOTE — ED Provider Notes (Signed)
[facility] EMERGENCY DEPARTMENT Provider Note   CSN: [id number] Arrival date & time: 07/11/18  1626     History   Chief Complaint Chief Complaint  Patient presents with  . Chest Pain    HPI Harry Morales is a 48 y.o. male.  The history is provided by the patient. No language interpreter was used.  Chest Pain     48 year old male with history of GERD presenting for evaluation of abdominal pain.  Patient report gradual onset of pain to his epigastrium region that radiates to his back and has been ongoing throughout the day.  Pain is moderate to severe, worsening with movement or with eating.  He endorsed nausea, has vomited several episodes of nonbloody nonbilious contents.  Endorsed mild shortness of breath only when the pain is intense.  Endorse objective fever.  Pain felt similar to heartburn but more intense.  Pain is not adequately relieved with Pepto-Bismol.  Patient denies any significant chest pain shortness of breath or cough, denies any dysuria, hematuria, bowel bladder changes.  He denies alcohol use or tobacco use.    Past Medical History:  Diagnosis Date  . Allergy   . Bronchitis   . GERD (gastroesophageal reflux disease)   . Pneumonia     Patient Active Problem List   Diagnosis Date Noted  . Nonintractable headache 05/18/2018    Past Surgical History:  Procedure Laterality Date  . CERVICAL SPINE SURGERY    . HERNIA REPAIR    . NECK SURGERY    . SPINE SURGERY          Home Medications    Prior to Admission medications   Medication Sig Start Date End Date Taking? Authorizing Provider  albuterol (PROVENTIL HFA;VENTOLIN HFA) 108 (90 BASE) MCG/ACT inhaler 2 puffs every 4-6 hours as needed for chest tightness or wheezing. 06/15/14   Collene Gobble, MD  benzonatate (TESSALON) 100 MG capsule Take 1-2 capsules (100-200 mg total) by mouth 3 (three) times daily as needed for cough. 05/18/18   Bing Neighbors, FNP  bismuth subsalicylate  (PEPTO BISMOL) 262 MG/15ML suspension Take 30 mLs by mouth every 6 (six) hours as needed.    [provider]  cetirizine (ZYRTEC) 10 MG tablet Take 1 tablet (10 mg total) by mouth daily. 05/18/18   Bing Neighbors, FNP  Multiple Vitamin (MULITIVITAMIN WITH MINERALS) TABS Take 1 tablet by mouth daily. Reported on 06/11/2015    [provider]  predniSONE (DELTASONE) 20 MG tablet Take 2 tablets (40 mg total) by mouth daily with breakfast. 05/18/18   Bing Neighbors, FNP    Family History Family History  Problem Relation Age of Onset  . Diverticulitis Mother   . Hypertension Other   . Heart failure Other     Social History Social History   Tobacco Use  . Smoking status: Never Smoker  . Smokeless tobacco: Never Used  Substance Use Topics  . Alcohol use: Yes    Comment: 2 a year  . Drug use: No     Allergies   Penicillins and Amoxicillin   Review of Systems Review of Systems  Cardiovascular: Positive for chest pain.  All other systems reviewed and are negative.    Physical Exam Updated Vital Signs BP (!) 150/114 (BP Location: Right Arm)   Pulse (!) 118   Temp (!) 97.5 F (36.4 C) (Oral)   Resp 18   Wt 59 kg   SpO2 100%   BMI 23.03 kg/m  Physical Exam Vitals signs and nursing note reviewed.  Constitutional:      General: He is not in acute distress.    Appearance: He is well-developed.     Comments: Appears uncomfortable but nontoxic  HENT:     Head: Atraumatic.  Eyes:     Conjunctiva/sclera: Conjunctivae normal.  Neck:     Musculoskeletal: Neck supple.  Cardiovascular:     Rate and Rhythm: Tachycardia present.     Heart sounds: Normal heart sounds.  Pulmonary:     Effort: Pulmonary effort is normal.     Breath sounds: Normal breath sounds.  Abdominal:     Palpations: Abdomen is soft.     Tenderness: There is abdominal tenderness (Tenderness to epigastric region and right upper quadrant on palpation without guarding or rebound  tenderness.).  Skin:    Findings: No rash.  Neurological:     Mental Status: He is alert.  Psychiatric:        Mood and Affect: Mood normal.      ED Treatments / Results  Labs (all labs ordered are listed, but only abnormal results are displayed) Labs Reviewed  BASIC METABOLIC PANEL - Abnormal; Notable for the following components:      Result Value   Glucose, Bld 140 (*)    Creatinine, Ser 1.31 (*)    All other components within normal limits  CBC - Abnormal; Notable for the following components:   WBC 17.7 (*)    RBC 6.64 (*)    Hemoglobin 18.9 (*)    HCT 56.7 (*)    All other components within normal limits  HEPATIC FUNCTION PANEL  LIPASE, BLOOD  I-STAT TROPONIN, ED    EKG None  ED ECG REPORT   Date: 07/11/2018  Rate: 123  Rhythm: sinus tachycardia  QRS Axis: normal  Intervals: normal  ST/T Wave abnormalities: nonspecific ST changes  Conduction Disutrbances:none  Narrative Interpretation:   Old EKG Reviewed: unchanged  I have personally reviewed the EKG tracing and agree with the computerized printout as noted.   Radiology Ct Abdomen Pelvis Wo Contrast  Result Date: 07/11/2018 CLINICAL DATA:  Epigastric pain. EXAM: CT ABDOMEN AND PELVIS WITHOUT CONTRAST TECHNIQUE: Multidetector CT imaging of the abdomen and pelvis was performed following the standard protocol without IV contrast. COMPARISON:  Abdominal ultrasound examination from today. FINDINGS: Lower chest: The lung bases are clear of an acute process. Minimal dependent subpleural atelectasis. The heart is normal in size. No pericardial effusion. Hepatobiliary: No focal hepatic lesions or intrahepatic biliary dilatation without contrast. Gallbladder is mildly distended and there are several gallstones noted. No significant pericholecystic inflammatory change or fluid. No common bile duct dilatation. Pancreas: The pancreas is inflamed/edematous and there is significant surrounding fluid and likely inflammatory  phlegmon consistent with acute pancreatitis. Moderate fluid noted in the anterior pararenal spaces bilaterally. Spleen: Normal size.  No focal lesions. Adrenals/Urinary Tract: The adrenal glands and kidneys are unremarkable. No renal, ureteral or bladder calculi. Stomach/Bowel: The stomach is unremarkable. There is moderate inflammation surrounding the second and third portions of the duodenum. No findings for obstruction. Contrast gets into the distal small bowel. The small bowel and colon are grossly normal. Vascular/Lymphatic: The aorta is normal in caliber. No atheroscerlotic calcifications. No mesenteric of retroperitoneal mass or adenopathy. Small scattered lymph nodes are noted. Reproductive: The prostate gland and seminal vesicles are unremarkable. Other: Moderate free pelvic fluid. No pelvic adenopathy. No inguinal adenopathy or hernia. Musculoskeletal: No significant bony findings. IMPRESSION: 1. Acute pancreatitis with  moderate pancreatic and peripancreatic inflammatory changes and free fluid in the anterior pararenal spaces and also in the pelvis. Without contrast no comment can be made about pancreatic necrosis. 2. Cholelithiasis without definite CT findings for acute cholecystitis. 3. Inflammation of the second and third portions of the duodenum due to the pancreatitis. Electronically Signed   By: Rudie MeyerP.  Gallerani M.D.   On: 07/11/2018 21:34   Dg Chest 2 View  Result Date: 07/11/2018 CLINICAL DATA:  Chest pain EXAM: CHEST - 2 VIEW COMPARISON:  06/15/2014 FINDINGS: The heart size and mediastinal contours are within normal limits. Both lungs are clear. The visualized skeletal structures are unremarkable. IMPRESSION: No active cardiopulmonary disease. Electronically Signed   By: Deatra RobinsonKevin  Herman M.D.   On: 07/11/2018 17:57   [place] Abdomen Limited  Result Date: 07/11/2018 CLINICAL DATA:  Right upper quadrant abdominal pain with nausea and vomiting. EXAM: ULTRASOUND ABDOMEN LIMITED RIGHT UPPER QUADRANT  COMPARISON:  None. FINDINGS: Gallbladder: Numerous gallstones are noted the gallbladder. The largest calculus measures 1.4 cm. No gallbladder wall thickening or pericholecystic fluid. Equivocal sonographic Murphy sign. Common bile duct: Diameter: 3.6 mm Liver: Normal echogenicity without focal lesion or biliary dilatation. Portal vein is patent on color Doppler imaging with normal direction of blood flow towards the liver. IMPRESSION: 1. Cholelithiasis without sonographic findings for acute cholecystitis other than an equivocal sonographic Murphy sign. 2. Normal caliber common bile duct and normal liver. Electronically Signed   By: Rudie MeyerP.  Gallerani M.D.   On: 07/11/2018 20:53    Procedures Procedures (including critical care time)  Medications Ordered in ED Medications  sodium chloride 0.9 % bolus 1,000 mL (has no administration in time range)  sodium chloride flush (NS) 0.9 % injection 3 mL (3 mLs Intravenous Given 07/11/18 1857)  ondansetron (ZOFRAN) injection 4 mg (4 mg Intravenous Given 07/11/18 1956)  morphine 4 MG/ML injection 4 mg (4 mg Intravenous Given 07/11/18 1956)  sodium chloride 0.9 % bolus 1,000 mL (0 mLs Intravenous Stopped 07/11/18 2116)  alum & mag hydroxide-simeth (MAALOX/MYLANTA) 200-200-20 MG/5ML suspension 30 mL (30 mLs Oral Given 07/11/18 1956)  morphine 4 MG/ML injection 4 mg (4 mg Intravenous Given 07/11/18 2136)     Initial Impression / Assessment and Plan / ED Course  I have reviewed the triage vital signs and the nursing notes.  Pertinent labs & imaging results that were available during my care of the patient were reviewed by me and considered in my medical decision making (see chart for details).     BP (!) 161/113   Pulse (!) 108   Temp (!) 97.5 F (36.4 C) (Oral)   Resp (!) 21   Ht 5\' 6"  (1.676 m)   Wt 59 kg   SpO2 97%   BMI 20.99 kg/m    Final Clinical Impressions(s) / ED Diagnoses   Final diagnoses:  Idiopathic acute pancreatitis without infection or  necrosis  Biliary calculus of other site without obstruction    ED Discharge Orders    None     7:13 PM Patient with upper abdominal pain radiates to back.  History of GERD.  Patient appears uncomfortable.  Work-up initiated.  Will obtain limited abdominal ultrasound for further evaluation.  Denies chest pain.  8:59 PM Labs remarkable for leukocytosis with WBC 17.7.  Evidence of AKI with creatinine 1.31.  EKG and troponin without acute ischemic changes.  Chest x-ray is unremarkable.  Limited abdominal ultrasound shows evidence of cholelithiasis without sonographic finding for acute cholecystitis.  We will check  hepatic function panel, lipase, as well as obtaining abdominal pelvis CT scan for further evaluation.  9:42 PM Patient has elevated lipase of 597, transaminitis with AST 235, ALT 251, and total bili of 1.9.  Abdominal pelvis CT scan demonstrate acute pancreatitis with moderate pancreatic and peripancreatic inflammatory changes and free fluid in the anterior pararenal space and also in the pelvis.  No evidence to suggest pancreatic necrosis.  Inflammation of the second and third portions of the duodenum due to pancreatitis.  Given this finding, will consult for admission. Suspect gallstone pancreatitis.  We will continue with IV hydration, n.p.o., pain medication.  Care discussed with Dr. Patria Maneampos  9:54 PM Appreciate consultation from [facility] Dr. Antionette Charpyd who will see pt in there ER and admit for further management. Pt is aware and agrees with plan.     Fayrene Helperran, , PA-C 07/11/18 2155    Azalia Bilisampos, Kevin, MD 07/11/18 2253

## 2018-07-12 DIAGNOSIS — K8010 Calculus of gallbladder with chronic cholecystitis without obstruction: Secondary | ICD-10-CM | POA: Diagnosis present

## 2018-07-12 DIAGNOSIS — D72829 Elevated white blood cell count, unspecified: Secondary | ICD-10-CM

## 2018-07-12 DIAGNOSIS — Z881 Allergy status to other antibiotic agents status: Secondary | ICD-10-CM | POA: Diagnosis not present

## 2018-07-12 DIAGNOSIS — K851 Biliary acute pancreatitis without necrosis or infection: Principal | ICD-10-CM

## 2018-07-12 DIAGNOSIS — K859 Acute pancreatitis without necrosis or infection, unspecified: Secondary | ICD-10-CM | POA: Diagnosis present

## 2018-07-12 DIAGNOSIS — N179 Acute kidney failure, unspecified: Secondary | ICD-10-CM | POA: Diagnosis present

## 2018-07-12 DIAGNOSIS — K8500 Idiopathic acute pancreatitis without necrosis or infection: Secondary | ICD-10-CM | POA: Diagnosis present

## 2018-07-12 DIAGNOSIS — I160 Hypertensive urgency: Secondary | ICD-10-CM | POA: Diagnosis present

## 2018-07-12 DIAGNOSIS — K219 Gastro-esophageal reflux disease without esophagitis: Secondary | ICD-10-CM | POA: Diagnosis present

## 2018-07-12 DIAGNOSIS — Z880 Allergy status to penicillin: Secondary | ICD-10-CM | POA: Diagnosis not present

## 2018-07-12 LAB — COMPREHENSIVE METABOLIC PANEL
ALT: 165 U/L — AB (ref 0–44)
AST: 85 U/L — AB (ref 15–41)
Albumin: 4 g/dL (ref 3.5–5.0)
Alkaline Phosphatase: 80 U/L (ref 38–126)
Anion gap: 14 (ref 5–15)
BUN: 11 mg/dL (ref 6–20)
CALCIUM: 8.8 mg/dL — AB (ref 8.9–10.3)
CO2: 23 mmol/L (ref 22–32)
CREATININE: 1.15 mg/dL (ref 0.61–1.24)
Chloride: 105 mmol/L (ref 98–111)
GFR calc Af Amer: >60 mL/min (ref >60)
GFR calc non Af Amer: >60 mL/min (ref >60)
Glucose, Bld: 129 mg/dL — ABNORMAL HIGH (ref 70–99)
Potassium: 4.3 mmol/L (ref 3.5–5.1)
Sodium: 142 mmol/L (ref 135–145)
Total Bilirubin: 0.9 mg/dL (ref 0.3–1.2)
Total Protein: 6.6 g/dL (ref 6.5–8.1)

## 2018-07-12 LAB — CBC WITH DIFFERENTIAL/PLATELET
Abs Immature Granulocytes: 0.13 10*3/uL — ABNORMAL HIGH (ref 0.00–0.07)
Basophils Absolute: 0 10*3/uL (ref 0.0–0.1)
Basophils Relative: 0 %
EOS ABS: 0.1 10*3/uL (ref 0.0–0.5)
Eosinophils Relative: 0 %
HCT: 55.7 % — ABNORMAL HIGH (ref 39.0–52.0)
Hemoglobin: 18.6 g/dL — ABNORMAL HIGH (ref 13.0–17.0)
Immature Granulocytes: 1 %
Lymphocytes Relative: 4 %
Lymphs Abs: 0.7 10*3/uL (ref 0.7–4.0)
MCH: 28.8 pg (ref 26.0–34.0)
MCHC: 33.4 g/dL (ref 30.0–36.0)
MCV: 86.4 fL (ref 80.0–100.0)
Monocytes Absolute: 0.9 10*3/uL (ref 0.1–1.0)
Monocytes Relative: 5 %
Neutro Abs: 17.5 10*3/uL — ABNORMAL HIGH (ref 1.7–7.7)
Neutrophils Relative %: 90 %
Platelets: 312 10*3/uL (ref 150–400)
RBC: 6.45 MIL/uL — ABNORMAL HIGH (ref 4.22–5.81)
RDW: 13.4 % (ref 11.5–15.5)
WBC: 19.3 10*3/uL — ABNORMAL HIGH (ref 4.0–10.5)
nRBC: 0 % (ref 0.0–0.2)

## 2018-07-12 LAB — HIV ANTIBODY (ROUTINE TESTING W REFLEX): HIV Screen 4th Generation wRfx: NONREACTIVE

## 2018-07-12 LAB — GLUCOSE, CAPILLARY: Glucose-Capillary: 126 mg/dL — ABNORMAL HIGH (ref 70–99)

## 2018-07-12 LAB — SURGICAL PCR SCREEN
MRSA, PCR: NEGATIVE
Staphylococcus aureus: NEGATIVE

## 2018-07-12 LAB — LIPASE, BLOOD: Lipase: 273 U/L — ABNORMAL HIGH (ref 11–51)

## 2018-07-12 LAB — TRIGLYCERIDES: Triglycerides: 57 mg/dL (ref <150)

## 2018-07-12 MED ORDER — SODIUM CHLORIDE 0.45 % IV SOLN
INTRAVENOUS | Status: DC
  Administered 2018-07-12 – 2018-07-13 (×3): via INTRAVENOUS

## 2018-07-12 MED ORDER — FAMOTIDINE IN NACL 20-0.9 MG/50ML-% IV SOLN
20.0000 mg | INTRAVENOUS | Status: DC
  Administered 2018-07-12 – 2018-07-14 (×3): 20 mg via INTRAVENOUS
  Filled 2018-07-12 (×4): qty 50

## 2018-07-12 MED ORDER — HYDRALAZINE HCL 20 MG/ML IJ SOLN
10.0000 mg | INTRAMUSCULAR | Status: DC | PRN
  Filled 2018-07-12: qty 1

## 2018-07-12 NOTE — Progress Notes (Signed)
Patient scored red MEWS. Implementing MEWs protocol text paged Dr. On call. Will follow closely

## 2018-07-12 NOTE — Consult Note (Signed)
[facility] Consult Note  Harry BreedingMichael T Morales 11/21/1970  [id number].    Requesting MD: Dr. Ella JubileeArrien  Chief Complaint/Reason for Consult: Abdominal pain, N/V; Gallstone Pancreatitis   HPI:  This a 48 year old male with a history of GERD, not on any daily medications who presented with abdominal pain.  He reports developing severe epigastric abdominal pain that radiated to his right upper quadrant and back and was associated with chills, nausea as well as vomiting yesterday morning, shortly after awakening.  He tried Pepto-Bismol x2 and Tums x8 with no relief.  The pain was constant, rated as a 9/10 and worse with positional changes. [facility] where he was admitted for gallstone pancreatitis.  He denies history of pancreatitis in the past.  He reports occasional alcohol use, typically on special occasions with his last drink on Sunday.  No previous abdominal surgeries.  He is not on any blood thinners or steroids.  We were asked to consult on the patient.    ED/Hospital Course:  RUQ US demonstrates numerous gallstones without gallbladder wall thickening or pericholecystic fluid. CBD 3.206mm. CT AP w/o contrast w/ acute pancreatitis w/ moderate inflammation. WBC 17.7,  Lipase 597, AST 235, ALT 251, T bil 1.9. Since admission his pain has waxed and waned, improving after doses of IV pain medication. He is currently pain free. LFTs and Lipase downtrending. T bili normalized.  ROS: Review of Systems  Constitutional: Positive for chills. Negative for fever.  HENT: Negative.   Eyes: Negative.   Respiratory: Positive for shortness of breath. Negative for cough and wheezing.   Cardiovascular: Negative for chest pain.  Gastrointestinal: Positive for abdominal pain, nausea and vomiting. Negative for blood in stool, constipation, diarrhea and melena.  Genitourinary: Negative.   Musculoskeletal: Negative.   Skin: Negative for rash.  Neurological: Negative.   Psychiatric/Behavioral: Negative  for substance abuse.  All other systems reviewed and are negative.   Family History  Problem Relation Age of Onset  . Diverticulitis Mother   . Hypertension Other   . Heart failure Other     Past Medical History:  Diagnosis Date  . Allergy   . Bronchitis   . GERD (gastroesophageal reflux disease)   . Pneumonia     Past Surgical History:  Procedure Laterality Date  . CERVICAL SPINE SURGERY    . HERNIA REPAIR    . NECK SURGERY    . SPINE SURGERY      Social History:  reports that he has never smoked. He has never used smokeless tobacco. He reports current alcohol use. He reports that he does not use drugs.   Allergies:  Allergies  Allergen Reactions  . Amoxicillin Rash  . Penicillins Rash    Medications Prior to Admission  Medication Sig Dispense Refill  . albuterol (PROVENTIL HFA;VENTOLIN HFA) 108 (90 BASE) MCG/ACT inhaler 2 puffs every 4-6 hours as needed for chest tightness or wheezing. (Patient taking differently: Inhale 2 puffs into the lungs every 6 (six) hours as needed for wheezing. ) 1 Inhaler 1    Blood pressure (!) 161/101, pulse (!) 107, temperature 98.2 F (36.8 C), temperature source Oral, resp. rate 19, height 5\' 3"  (1.6 m), weight 58 kg, SpO2 95 %. Physical Exam: Physical Exam Vitals signs and nursing note reviewed.  Constitutional:      Appearance: He is well-developed. He is not diaphoretic.  HENT:     Head: Normocephalic and atraumatic.     Right Ear: External ear normal.  Left Ear: External ear normal.     Nose: Nose normal.     Mouth/Throat:     Mouth: Mucous membranes are moist.     Pharynx: Uvula midline.     Tonsils: No tonsillar exudate.  Eyes:     General: No scleral icterus.       Right eye: No discharge.        Left eye: No discharge.     Pupils: Pupils are equal, round, and reactive to light.  Neck:     Musculoskeletal: Neck supple. Normal range of motion. No neck rigidity or spinous process tenderness.     Trachea: Trachea  normal.  Cardiovascular:     Rate and Rhythm: Normal rate and regular rhythm.     Pulses:          Radial pulses are 2+ on the right side and 2+ on the left side.       Dorsalis pedis pulses are 2+ on the right side and 2+ on the left side.       Posterior tibial pulses are 2+ on the right side and 2+ on the left side.     Heart sounds: No murmur.  Pulmonary:     Effort: Pulmonary effort is normal.     Breath sounds: Normal breath sounds.  Chest:     Chest wall: No tenderness.  Abdominal:     General: Bowel sounds are normal.     Palpations: Abdomen is soft.     Tenderness: There is abdominal tenderness in the right upper quadrant and epigastric area. There is no guarding or rebound.  Musculoskeletal:     Right lower leg: No edema.     Left lower leg: No edema.  Lymphadenopathy:     Cervical: No cervical adenopathy.  Skin:    General: Skin is warm and dry.     Findings: No rash.  Neurological:     Mental Status: He is alert.     Results for orders placed or performed during the hospital encounter of 07/11/18 (from the past 48 hour(s))  Basic metabolic panel     Status: Abnormal   Collection Time: 07/11/18  4:55 PM  Result Value Ref Range   Sodium 137 135 - 145 mmol/L   Potassium 4.0 3.5 - 5.1 mmol/L   Chloride 103 98 - 111 mmol/L   CO2 22 22 - 32 mmol/L   Glucose, Bld 140 (H) 70 - 99 mg/dL   BUN 10 6 - 20 mg/dL   Creatinine, Ser 0.98 (H) 0.61 - 1.24 mg/dL   Calcium 11.9 8.9 - 14.7 mg/dL   GFR calc non Af Amer >60 >60 mL/min   GFR calc Af Amer >60 >60 mL/min   Anion gap 12 5 - 15    Comment: Performed at [facility], [address] [place], [place] [address]  CBC     Status: Abnormal   Collection Time: 07/11/18  4:55 PM  Result Value Ref Range   WBC 17.7 (H) 4.0 - 10.5 K/uL   RBC 6.64 (H) 4.22 - 5.81 MIL/uL   Hemoglobin 18.9 (H) 13.0 - 17.0 g/dL   HCT 21.3 (H) 08.6 - 57.8 %    Comment: REPEATED TO VERIFY   MCV 85.4 80.0 - 100.0 fL   MCH 28.5 26.0 - 34.0  pg   MCHC 33.3 30.0 - 36.0 g/dL   RDW 46.9 62.9 - 52.8 %   Platelets 339 150 - 400 K/uL   nRBC  0.0 0.0 - 0.2 %    Comment: Performed at [facility], [address] [place], [place] [address]  I-stat troponin, ED     Status: None   Collection Time: 07/11/18  5:03 PM  Result Value Ref Range   Troponin i, poc 0.01 0.00 - 0.08 ng/mL   Comment 3            Comment: Due to the release kinetics of cTnI, a negative result within the first hours of the onset of symptoms does not rule out myocardial infarction with certainty. If myocardial infarction is still suspected, repeat the test at appropriate intervals.   Hepatic function panel     Status: Abnormal   Collection Time: 07/11/18  8:58 PM  Result Value Ref Range   Total Protein 7.5 6.5 - 8.1 g/dL   Albumin 4.6 3.5 - 5.0 g/dL   AST 604235 (H) 15 - 41 U/L   ALT 251 (H) 0 - 44 U/L   Alkaline Phosphatase 92 38 - 126 U/L   Total Bilirubin 1.9 (H) 0.3 - 1.2 mg/dL   Bilirubin, Direct 0.7 (H) 0.0 - 0.2 mg/dL   Indirect Bilirubin 1.2 (H) 0.3 - 0.9 mg/dL    Comment: Performed at [facility], [address] [place], [place] [address]  Lipase, blood     Status: Abnormal   Collection Time: 07/11/18  8:58 PM  Result Value Ref Range   Lipase 597 (H) 11 - 51 U/L    Comment: RESULTS CONFIRMED BY MANUAL DILUTION Performed at [facility], [address] [place], [place] [address]   Surgical pcr screen     Status: None   Collection Time: 07/11/18 11:52 PM  Result Value Ref Range   MRSA, PCR NEGATIVE NEGATIVE   Staphylococcus aureus NEGATIVE NEGATIVE    Comment: (NOTE) The Xpert SA Assay (FDA approved for NASAL specimens in patients 49 years of age and older), is one component of a comprehensive surveillance program. It is not intended to diagnose infection nor to guide or monitor treatment. Performed at [facility], [address] [place], [place] [address]   HIV antibody (Routine Testing)     Status: None    Collection Time: 07/12/18  2:10 AM  Result Value Ref Range   HIV Screen 4th Generation wRfx Non Reactive Non Reactive    Comment: (NOTE) Performed At: [facility] [address] [place], [place] [id number] Jolene SchimkeNagendra Sanjai MD QI:[id number]:[phone]   Comprehensive metabolic panel     Status: Abnormal   Collection Time: 07/12/18  2:10 AM  Result Value Ref Range   Sodium 142 135 - 145 mmol/L   Potassium 4.3 3.5 - 5.1 mmol/L   Chloride 105 98 - 111 mmol/L   CO2 23 22 - 32 mmol/L   Glucose, Bld 129 (H) 70 - 99 mg/dL   BUN 11 6 - 20 mg/dL   Creatinine, Ser 3.241.15 0.61 - 1.24 mg/dL   Calcium 8.8 (L) 8.9 - 10.3 mg/dL   Total Protein 6.6 6.5 - 8.1 g/dL   Albumin 4.0 3.5 - 5.0 g/dL   AST 85 (H) 15 - 41 U/L   ALT 165 (H) 0 - 44 U/L   Alkaline Phosphatase 80 38 - 126 U/L   Total Bilirubin 0.9 0.3 - 1.2 mg/dL   GFR calc non Af Amer >60 >60 mL/min   GFR calc Af Amer >60 >60 mL/min   Anion gap 14 5 - 15  Comment: Performed at [facility], [address] [place], [place] [address]  CBC WITH DIFFERENTIAL     Status: Abnormal   Collection Time: 07/12/18  2:10 AM  Result Value Ref Range   WBC 19.3 (H) 4.0 - 10.5 K/uL   RBC 6.45 (H) 4.22 - 5.81 MIL/uL   Hemoglobin 18.6 (H) 13.0 - 17.0 g/dL   HCT 38.3 (H) 29.1 - 91.6 %   MCV 86.4 80.0 - 100.0 fL   MCH 28.8 26.0 - 34.0 pg   MCHC 33.4 30.0 - 36.0 g/dL   RDW 60.6 00.4 - 59.9 %   Platelets 312 150 - 400 K/uL   nRBC 0.0 0.0 - 0.2 %   Neutrophils Relative % 90 %   Neutro Abs 17.5 (H) 1.7 - 7.7 K/uL   Lymphocytes Relative 4 %   Lymphs Abs 0.7 0.7 - 4.0 K/uL   Monocytes Relative 5 %   Monocytes Absolute 0.9 0.1 - 1.0 K/uL   Eosinophils Relative 0 %   Eosinophils Absolute 0.1 0.0 - 0.5 K/uL   Basophils Relative 0 %   Basophils Absolute 0.0 0.0 - 0.1 K/uL   Immature Granulocytes 1 %   Abs Immature Granulocytes 0.13 (H) 0.00 - 0.07 K/uL    Comment: Performed at [facility], [address] [place], [place] [address]  Lipase,  blood     Status: Abnormal   Collection Time: 07/12/18  2:10 AM  Result Value Ref Range   Lipase 273 (H) 11 - 51 U/L    Comment: Performed at [facility], [address], [place], [place] [address]  Triglycerides     Status: None   Collection Time: 07/12/18  2:10 AM  Result Value Ref Range   Triglycerides 57 <150 mg/dL    Comment: Performed at [facility], [address] [place], [place] [address]  Glucose, capillary     Status: Abnormal   Collection Time: 07/12/18  7:48 AM  Result Value Ref Range   Glucose-Capillary 126 (H) 70 - 99 mg/dL   Ct Abdomen Pelvis Wo Contrast  Result Date: 07/11/2018 CLINICAL DATA:  Epigastric pain. EXAM: CT ABDOMEN AND PELVIS WITHOUT CONTRAST TECHNIQUE: Multidetector CT imaging of the abdomen and pelvis was performed following the standard protocol without IV contrast. COMPARISON:  Abdominal ultrasound examination from today. FINDINGS: Lower chest: The lung bases are clear of an acute process. Minimal dependent subpleural atelectasis. The heart is normal in size. No pericardial effusion. Hepatobiliary: No focal hepatic lesions or intrahepatic biliary dilatation without contrast. Gallbladder is mildly distended and there are several gallstones noted. No significant pericholecystic inflammatory change or fluid. No common bile duct dilatation. Pancreas: The pancreas is inflamed/edematous and there is significant surrounding fluid and likely inflammatory phlegmon consistent with acute pancreatitis. Moderate fluid noted in the anterior pararenal spaces bilaterally. Spleen: Normal size.  No focal lesions. Adrenals/Urinary Tract: The adrenal glands and kidneys are unremarkable. No renal, ureteral or bladder calculi. Stomach/Bowel: The stomach is unremarkable. There is moderate inflammation surrounding the second and third portions of the duodenum. No findings for obstruction. Contrast gets into the distal small bowel. The small bowel and colon are grossly normal.  Vascular/Lymphatic: The aorta is normal in caliber. No atheroscerlotic calcifications. No mesenteric of retroperitoneal mass or adenopathy. Small scattered lymph nodes are noted. Reproductive: The prostate gland and seminal vesicles are unremarkable. Other: Moderate free pelvic fluid. No pelvic adenopathy. No inguinal adenopathy or hernia. Musculoskeletal: No significant bony findings.  IMPRESSION: 1. Acute pancreatitis with moderate pancreatic and peripancreatic inflammatory changes and free fluid in the anterior pararenal spaces and also in the pelvis. Without contrast no comment can be made about pancreatic necrosis. 2. Cholelithiasis without definite CT findings for acute cholecystitis. 3. Inflammation of the second and third portions of the duodenum due to the pancreatitis. Electronically Signed   By: Rudie Meyer M.D.   On: 07/11/2018 21:34   Dg Chest 2 View  Result Date: 07/11/2018 CLINICAL DATA:  Chest pain EXAM: CHEST - 2 VIEW COMPARISON:  06/15/2014 FINDINGS: The heart size and mediastinal contours are within normal limits. Both lungs are clear. The visualized skeletal structures are unremarkable. IMPRESSION: No active cardiopulmonary disease. Electronically Signed   By: Deatra Robinson M.D.   On: 07/11/2018 17:57   US Abdomen Limited  Result Date: 07/11/2018 CLINICAL DATA:  Right upper quadrant abdominal pain with nausea and vomiting. EXAM: ULTRASOUND ABDOMEN LIMITED RIGHT UPPER QUADRANT COMPARISON:  None. FINDINGS: Gallbladder: Numerous gallstones are noted the gallbladder. The largest calculus measures 1.4 cm. No gallbladder wall thickening or pericholecystic fluid. Equivocal sonographic Murphy sign. Common bile duct: Diameter: 3.6 mm Liver: Normal echogenicity without focal lesion or biliary dilatation. Portal vein is patent on color Doppler imaging with normal direction of blood flow towards the liver. IMPRESSION: 1. Cholelithiasis without sonographic findings for acute cholecystitis other than  an equivocal sonographic Murphy sign. 2. Normal caliber common bile duct and normal liver. Electronically Signed   By: Rudie Meyer M.D.   On: 07/11/2018 20:53    Assessment/Plan Gallstone Pancreatitis - On admission WBC 17,700,  Lipase 597, AST 235, ALT 251, T bil 1.9. CBD 3.47mm on [place] - LFTs and Lipase downtrending since admission - Clinically patients pain is improving.  - Possible Lap Chole tomorrow pending labs and reassessment in AM. Will make NPO after midnight -I have explained the procedure, risks, and aftercare of cholecystectomy.  Risks include but are not limited to bleeding, infection, wound problems, anesthesia, diarrhea, bile leak, injury to common bile duct/liver/intestine.  He seems to understand and agrees to proceed.   Jacinto Halim, [facility] 07/12/2018, 3:13 PM Pager: [phone] Consults: [phone] Mon-Fri 7:00 am-4:30 pm

## 2018-07-12 NOTE — Progress Notes (Signed)
PROGRESS NOTE    Harry Morales  LFY:[id number] DOB: January 13, 1971 DOA: 07/11/2018 PCP: [facility]    Brief Narrative:  48 year old male who presented with epigastric abdominal pain, nausea and vomiting.  He has no significant past medical history.  Reported acute onset of epigastric abdominal pain associated with nausea and vomiting.  Symptoms were refractive to over-the-counter antiacids.  Physical examination his blood pressure was 180/100, heart rate 123 rate 21, temperature 97.5 F, oxygen saturation 100%.  Patient was noted to be uncomfortable, moist mucous membranes, lungs clear to auscultation bilaterally, heart S1-2 present rhythmic, abdomen tender to palpation in the epigastric, no rebound or guarding, no lower extremity edema.  Sodium 137, potassium 4.0, chloride 103, bicarb 22, glucose 140, BUN 10, creatinine 1.31, lipase 597, white count 17.7, hemoglobin 18.9, hematocrit 56.7, platelets 339.  Abdominal and pelvis CT showed acute pancreatitis with moderate pancreatic and peripancreatic inflammatory changes, free fluid anterior pararenal spaces and also in the pelvis.  No pancreatic necrosis.  Positive cholelithiasis without findings of cholecystitis.  Inflammation of the second and third portion of the duodenum due to acute pancreatitis.  Chest x-ray was negative for infiltrates.  Ultrasound of the abdomen with cholelithiasis without sonographic findings for acute cholecystitis.  Normal caliber common bile duct with normal liver.  EKG with normal sinus rhythm, normal axis and normal intervals.  Patient was admitted to the hospital with the working diagnosis of acute gallstone pancreatitis.   Assessment & Plan:   Principal Problem:   Acute gallstone pancreatitis Active Problems:   Hypertensive urgency   Leukocytosis   1. Acute gallstone pancreatitis. Patient has a BISAP score of one, consistent in increased mortality risk. CT with significant inflammatory changes.  Will hold on antibiotic therapy due to the absence of necrosis. Today continue to be nauseated, and in pain, will keep IV fluids with half normal saline, IV analgesics with IV hydromorphone and as needed antiemetics. Will add antiacid therapy IV. Patient will need cholecystectomy on this admission.   2. Gallstones. No clinical sign of cholecystitis, but very likely the culprit of pancreatitis. Will continue patient npo, consult surgery for cholecystectomy on this admission.  3. Reactive leukocytosis. Worsening leukocytosis, no signs of pancreatic necrosis from Ct of the abdomen. Will continue to hold on antibiotic therapy for now. Follow on cell count in am.   4. Hypertension. Systolic blood pressure 160, will continue pain control and as needed IV hydralazine.   DVT prophylaxis: enoxaparin   Code Status: full Family Communication: no family at the bedside  Disposition Plan/ discharge barriers: pending clinical improvement. Patient continue symptomatic, not yet back to baseline, will change to inpatient.   Body mass index is 22.65 kg/m. Malnutrition Type:      Malnutrition Characteristics:      Nutrition Interventions:     RN Pressure Injury Documentation:     Consultants:   Surgery   Procedures:     Antimicrobials:       Subjective: Patient continue to have abdominal pain, moderate to sever in intensity, epigastric in location, improved with IV analgesics, worse with movement and associated with nausea.   Objective: Vitals:   07/11/18 1945 07/11/18 2028 07/11/18 2300 07/12/18 0402  BP: (!) 161/113   (!) 160/97  Pulse: (!) 108   (!) 104  Resp: (!) 21   20  Temp:    98.9 F (37.2 C)  TempSrc:    Oral  SpO2: 97%   95%  Weight:  59 kg 58 kg  Height:  5\' 6"  (1.676 m) 5\' 3"  (1.6 m)     Intake/Output Summary (Last 24 hours) at 07/12/2018 1327 Last data filed at 07/12/2018 0500 Gross per 24 hour  Intake 1597.96 ml  Output -  Net 1597.96 ml   Filed Weights    07/11/18 1633 07/11/18 2028 07/11/18 2300  Weight: 59 kg 59 kg 58 kg    Examination:   General: deconditioned and in pain.  Neurology: Awake and alert, non focal  E ENT: mild pallor, no icterus, oral mucosa moist Cardiovascular: No JVD. S1-S2 present, rhythmic, no gallops, rubs, or murmurs. No lower extremity edema. Pulmonary: positive breath sounds bilaterally, adequate air movement, no wheezing, rhonchi or rales. Gastrointestinal. Abdomen mild distended, tender to superficial palpation,  no organomegaly, mild guarding. Skin. No rashes Musculoskeletal: no joint deformities     Data Reviewed: I have personally reviewed following labs and imaging studies  CBC: Recent Labs  Lab 07/11/18 1655 07/12/18 0210  WBC 17.7* 19.3*  NEUTROABS  --  17.5*  HGB 18.9* 18.6*  HCT 56.7* 55.7*  MCV 85.4 86.4  PLT 339 312   Basic Metabolic Panel: Recent Labs  Lab 07/11/18 1655 07/12/18 0210  NA 137 142  K 4.0 4.3  CL 103 105  CO2 22 23  GLUCOSE 140* 129*  BUN 10 11  CREATININE 1.31* 1.15  CALCIUM 10.1 8.8*   GFR: Estimated Creatinine Clearance: 63.2 mL/min (by C-G formula based on SCr of 1.15 mg/dL). Liver Function Tests: Recent Labs  Lab 07/11/18 2058 07/12/18 0210  AST 235* 85*  ALT 251* 165*  ALKPHOS 92 80  BILITOT 1.9* 0.9  PROT 7.5 6.6  ALBUMIN 4.6 4.0   Recent Labs  Lab 07/11/18 2058 07/12/18 0210  LIPASE 597* 273*   No results for input(s): AMMONIA in the last 168 hours. Coagulation Profile: No results for input(s): INR, PROTIME in the last 168 hours. Cardiac Enzymes: No results for input(s): CKTOTAL, CKMB, CKMBINDEX, TROPONINI in the last 168 hours. BNP (last 3 results) No results for input(s): PROBNP in the last 8760 hours. HbA1C: No results for input(s): HGBA1C in the last 72 hours. CBG: Recent Labs  Lab 07/12/18 0748  GLUCAP 126*   Lipid Profile: Recent Labs    07/12/18 0210  TRIG 57   Thyroid Function Tests: No results for input(s):  TSH, T4TOTAL, FREET4, T3FREE, THYROIDAB in the last 72 hours. Anemia Panel: No results for input(s): VITAMINB12, FOLATE, FERRITIN, TIBC, IRON, RETICCTPCT in the last 72 hours.    Radiology Studies: I have reviewed all of the imaging during this hospital visit personally     Scheduled Meds: . enoxaparin (LOVENOX) injection  40 mg Subcutaneous Daily   Continuous Infusions: . sodium chloride       LOS: 0 days         Annett Gulaaniel , MD

## 2018-07-12 NOTE — Progress Notes (Addendum)
Paged MD concerning patient's BP. Awaiting response.   Continue to monitor per Dr. Ella Jubilee

## 2018-07-13 LAB — CBC WITH DIFFERENTIAL/PLATELET
Band Neutrophils: 0 %
Basophils Absolute: 0 10*3/uL (ref 0.0–0.1)
Basophils Relative: 0 %
Blasts: 0 %
Eosinophils Absolute: 0 10*3/uL (ref 0.0–0.5)
Eosinophils Relative: 0 %
HCT: 46.7 % (ref 39.0–52.0)
Hemoglobin: 15.8 g/dL (ref 13.0–17.0)
Lymphocytes Relative: 4 %
Lymphs Abs: 1.1 10*3/uL (ref 0.7–4.0)
MCH: 29.6 pg (ref 26.0–34.0)
MCHC: 33.8 g/dL (ref 30.0–36.0)
MCV: 87.5 fL (ref 80.0–100.0)
MYELOCYTES: 0 %
Metamyelocytes Relative: 0 %
Monocytes Absolute: 1.7 10*3/uL — ABNORMAL HIGH (ref 0.1–1.0)
Monocytes Relative: 6 %
NRBC: 0 /100{WBCs}
Neutro Abs: 24.9 10*3/uL — ABNORMAL HIGH (ref 1.7–7.7)
Neutrophils Relative %: 90 %
Other: 0 %
Platelets: 234 10*3/uL (ref 150–400)
Promyelocytes Relative: 0 %
RBC: 5.34 MIL/uL (ref 4.22–5.81)
RDW: 14 % (ref 11.5–15.5)
WBC: 27.7 10*3/uL — ABNORMAL HIGH (ref 4.0–10.5)
nRBC: 0 % (ref 0.0–0.2)

## 2018-07-13 LAB — COMPREHENSIVE METABOLIC PANEL
ALT: 73 U/L — ABNORMAL HIGH (ref 0–44)
AST: 34 U/L (ref 15–41)
Albumin: 3.3 g/dL — ABNORMAL LOW (ref 3.5–5.0)
Alkaline Phosphatase: 60 U/L (ref 38–126)
Anion gap: 12 (ref 5–15)
BUN: 7 mg/dL (ref 6–20)
CO2: 25 mmol/L (ref 22–32)
Calcium: 8.7 mg/dL — ABNORMAL LOW (ref 8.9–10.3)
Chloride: 99 mmol/L (ref 98–111)
Creatinine, Ser: 1.07 mg/dL (ref 0.61–1.24)
GFR calc non Af Amer: >60 mL/min (ref >60)
Glucose, Bld: 120 mg/dL — ABNORMAL HIGH (ref 70–99)
Potassium: 3.7 mmol/L (ref 3.5–5.1)
Sodium: 136 mmol/L (ref 135–145)
Total Bilirubin: 1.1 mg/dL (ref 0.3–1.2)
Total Protein: 6.7 g/dL (ref 6.5–8.1)

## 2018-07-13 LAB — LIPASE, BLOOD: Lipase: 171 U/L — ABNORMAL HIGH (ref 11–51)

## 2018-07-13 LAB — GLUCOSE, CAPILLARY: Glucose-Capillary: 110 mg/dL — ABNORMAL HIGH (ref 70–99)

## 2018-07-13 NOTE — Progress Notes (Signed)
Patient ID: Harry BreedingMichael T Ayre, male   DOB: 09/07/1970, 48 y.o.   MRN: [id number] [facility] Progress Note:   * No surgery found *  Subjective: Mental status is clear;  Still hurting a lot-midabdominal radiating into the back Objective: Vital signs in last 24 hours: Temp:  [98.2 F (36.8 C)-99.9 F (37.7 C)] 99.5 F (37.5 C) (02/01 0448) Pulse Rate:  [104-120] 111 (02/01 0448) Resp:  [16-28] 20 (02/01 0448) BP: (138-161)/(90-101) 149/90 (02/01 0448) SpO2:  [95 %-99 %] 96 % (02/01 0448)  Intake/Output from previous day: 01/31 0701 - 02/01 0700 In: 1174 [I.V.:1174] Out: -  Intake/Output this shift: No intake/output data recorded.  Physical Exam: Work of breathing is not labored at rest.  Hurting in mid abdomen radiating into the back.  Lab Results:  Results for orders placed or performed during the hospital encounter of 07/11/18 (from the past 48 hour(s))  Basic metabolic panel     Status: Abnormal   Collection Time: 07/11/18  4:55 PM  Result Value Ref Range   Sodium 137 135 - 145 mmol/L   Potassium 4.0 3.5 - 5.1 mmol/L   Chloride 103 98 - 111 mmol/L   CO2 22 22 - 32 mmol/L   Glucose, Bld 140 (H) 70 - 99 mg/dL   BUN 10 6 - 20 mg/dL   Creatinine, Ser 9.141.31 (H) 0.61 - 1.24 mg/dL   Calcium 78.210.1 8.9 - 95.610.3 mg/dL   GFR calc non Af Amer >60 >60 mL/min   GFR calc Af Amer >60 >60 mL/min   Anion gap 12 5 - 15    Comment: Performed at [facility], [address] [place], [place] [address]  CBC     Status: Abnormal   Collection Time: 07/11/18  4:55 PM  Result Value Ref Range   WBC 17.7 (H) 4.0 - 10.5 K/uL   RBC 6.64 (H) 4.22 - 5.81 MIL/uL   Hemoglobin 18.9 (H) 13.0 - 17.0 g/dL   HCT 65.756.7 (H) 84.639.0 - 96.252.0 %    Comment: REPEATED TO VERIFY   MCV 85.4 80.0 - 100.0 fL   MCH 28.5 26.0 - 34.0 pg   MCHC 33.3 30.0 - 36.0 g/dL   RDW 95.213.0 84.111.5 - 32.415.5 %   Platelets 339 150 - 400 K/uL   nRBC 0.0 0.0 - 0.2 %    Comment: Performed at [facility], [address] [place], [place] [address]  I-stat troponin, ED     Status: None   Collection Time: 07/11/18  5:03 PM  Result Value Ref Range   Troponin i, poc 0.01 0.00 - 0.08 ng/mL   Comment 3            Comment: Due to the release kinetics of cTnI, a negative result within the first hours of the onset of symptoms does not rule out myocardial infarction with certainty. If myocardial infarction is still suspected, repeat the test at appropriate intervals.   Hepatic function panel     Status: Abnormal   Collection Time: 07/11/18  8:58 PM  Result Value Ref Range   Total Protein 7.5 6.5 - 8.1 g/dL   Albumin 4.6 3.5 - 5.0 g/dL   AST 725235 (H) 15 - 41 U/L   ALT 251 (H) 0 - 44 U/L   Alkaline Phosphatase 92 38 - 126 U/L   Total Bilirubin 1.9 (H) 0.3 - 1.2 mg/dL   Bilirubin, Direct 0.7 (H) 0.0 - 0.2 mg/dL   Indirect  Bilirubin 1.2 (H) 0.3 - 0.9 mg/dL    Comment: Performed at [facility], [address] [place], [place] [address]  Lipase, blood     Status: Abnormal   Collection Time: 07/11/18  8:58 PM  Result Value Ref Range   Lipase 597 (H) 11 - 51 U/L    Comment: RESULTS CONFIRMED BY MANUAL DILUTION Performed at [facility], [address] [place], [place] [address]   Surgical pcr screen     Status: None   Collection Time: 07/11/18 11:52 PM  Result Value Ref Range   MRSA, PCR NEGATIVE NEGATIVE   Staphylococcus aureus NEGATIVE NEGATIVE    Comment: (NOTE) The Xpert SA Assay (FDA approved for NASAL specimens in patients 49 years of age and older), is one component of a comprehensive surveillance program. It is not intended to diagnose infection nor to guide or monitor treatment. Performed at [facility], [address] [place], [place] [address]   HIV antibody (Routine Testing)     Status: None   Collection Time: 07/12/18  2:10 AM  Result Value Ref Range   HIV Screen 4th Generation wRfx Non Reactive Non Reactive    Comment: (NOTE) Performed At: [facility] [address] [place], [place] [id number] Jolene SchimkeNagendra Sanjai MD OZ:[id number]:[phone]   Comprehensive metabolic panel     Status: Abnormal   Collection Time: 07/12/18  2:10 AM  Result Value Ref Range   Sodium 142 135 - 145 mmol/L   Potassium 4.3 3.5 - 5.1 mmol/L   Chloride 105 98 - 111 mmol/L   CO2 23 22 - 32 mmol/L   Glucose, Bld 129 (H) 70 - 99 mg/dL   BUN 11 6 - 20 mg/dL   Creatinine, Ser 6.291.15 0.61 - 1.24 mg/dL   Calcium 8.8 (L) 8.9 - 10.3 mg/dL   Total Protein 6.6 6.5 - 8.1 g/dL   Albumin 4.0 3.5 - 5.0 g/dL   AST 85 (H) 15 - 41 U/L   ALT 165 (H) 0 - 44 U/L   Alkaline Phosphatase 80 38 - 126 U/L   Total Bilirubin 0.9 0.3 - 1.2 mg/dL   GFR calc non Af Amer >60 >60 mL/min   GFR calc Af Amer >60 >60 mL/min   Anion gap 14 5 - 15    Comment: Performed at [facility], [address] [place], [place] [address]  CBC WITH DIFFERENTIAL     Status: Abnormal   Collection Time: 07/12/18  2:10 AM  Result Value Ref Range   WBC 19.3 (H) 4.0 - 10.5 K/uL   RBC 6.45 (H) 4.22 - 5.81 MIL/uL   Hemoglobin 18.6 (H) 13.0 - 17.0 g/dL   HCT 32.455.7 (H) 40.139.0 - 02.752.0 %   MCV 86.4 80.0 - 100.0 fL   MCH 28.8 26.0 - 34.0 pg   MCHC 33.4 30.0 - 36.0 g/dL   RDW 25.313.4 66.411.5 - 40.315.5 %   Platelets 312 150 - 400 K/uL   nRBC 0.0 0.0 - 0.2 %   Neutrophils Relative % 90 %   Neutro Abs 17.5 (H) 1.7 - 7.7 K/uL   Lymphocytes Relative 4 %   Lymphs Abs 0.7 0.7 - 4.0 K/uL   Monocytes Relative 5 %   Monocytes Absolute 0.9 0.1 - 1.0 K/uL   Eosinophils Relative 0 %   Eosinophils Absolute 0.1 0.0 - 0.5 K/uL   Basophils Relative 0 %   Basophils Absolute 0.0 0.0 - 0.1 K/uL  Immature Granulocytes 1 %   Abs Immature Granulocytes 0.13 (H) 0.00 - 0.07 K/uL    Comment: Performed at [facility], [address], [place], [place] [address]  Lipase, blood     Status: Abnormal   Collection Time: 07/12/18  2:10 AM  Result Value Ref Range   Lipase 273 (H) 11 - 51 U/L    Comment: Performed at [facility], [address]  [place], [place] [address]  Triglycerides     Status: None   Collection Time: 07/12/18  2:10 AM  Result Value Ref Range   Triglycerides 57 <150 mg/dL    Comment: Performed at [facility], [address], [place], [place] [address]  Glucose, capillary     Status: Abnormal   Collection Time: 07/12/18  7:48 AM  Result Value Ref Range   Glucose-Capillary 126 (H) 70 - 99 mg/dL  CBC with Differential/Platelet     Status: Abnormal   Collection Time: 07/13/18  3:43 AM  Result Value Ref Range   WBC 27.7 (H) 4.0 - 10.5 K/uL    Comment: WHITE COUNT CONFIRMED ON SMEAR   RBC 5.34 4.22 - 5.81 MIL/uL   Hemoglobin 15.8 13.0 - 17.0 g/dL   HCT 08.6 76.1 - 95.0 %   MCV 87.5 80.0 - 100.0 fL   MCH 29.6 26.0 - 34.0 pg   MCHC 33.8 30.0 - 36.0 g/dL   RDW 93.2 67.1 - 24.5 %   Platelets 234 150 - 400 K/uL   nRBC 0.0 0.0 - 0.2 %   Neutrophils Relative % 90 %   Lymphocytes Relative 4 %   Monocytes Relative 6 %   Eosinophils Relative 0 %   Basophils Relative 0 %   Band Neutrophils 0 %   Metamyelocytes Relative 0 %   Myelocytes 0 %   Promyelocytes Relative 0 %   Blasts 0 %   nRBC 0 0 /100 WBC   Other 0 %   Neutro Abs 24.9 (H) 1.7 - 7.7 K/uL   Lymphs Abs 1.1 0.7 - 4.0 K/uL   Monocytes Absolute 1.7 (H) 0.1 - 1.0 K/uL   Eosinophils Absolute 0.0 0.0 - 0.5 K/uL   Basophils Absolute 0.0 0.0 - 0.1 K/uL   WBC Morphology MILD LEFT SHIFT (1-5% METAS, OCC MYELO, OCC BANDS)     Comment: VACUOLATED NEUTROPHILS Performed at [facility], [address], [place], [place] [address]   Comprehensive metabolic panel     Status: Abnormal   Collection Time: 07/13/18  3:43 AM  Result Value Ref Range   Sodium 136 135 - 145 mmol/L   Potassium 3.7 3.5 - 5.1 mmol/L   Chloride 99 98 - 111 mmol/L   CO2 25 22 - 32 mmol/L   Glucose, Bld 120 (H) 70 - 99 mg/dL   BUN 7 6 - 20 mg/dL   Creatinine, Ser 3.38 0.61 - 1.24 mg/dL   Calcium 8.7 (L) 8.9 - 10.3 mg/dL   Total Protein 6.7 6.5 - 8.1 g/dL   Albumin 3.3 (L) 3.5 -  5.0 g/dL   AST 34 15 - 41 U/L   ALT 73 (H) 0 - 44 U/L   Alkaline Phosphatase 60 38 - 126 U/L   Total Bilirubin 1.1 0.3 - 1.2 mg/dL   GFR calc non Af Amer >60 >60 mL/min   GFR calc Af Amer >60 >60 mL/min   Anion gap 12 5 - 15    Comment: Performed at [employer]  [facility], [address], [place], [place] [address]  Lipase, blood     Status: Abnormal   Collection Time: 07/13/18  3:43 AM  Result Value Ref Range   Lipase 171 (H) 11 - 51 U/L    Comment: Performed at [facility], [address], [place], [place] [address]  Glucose, capillary     Status: Abnormal   Collection Time: 07/13/18  7:53 AM  Result Value Ref Range   Glucose-Capillary 110 (H) 70 - 99 mg/dL    Radiology/Results: Ct Abdomen Pelvis Wo Contrast  Result Date: 07/11/2018 CLINICAL DATA:  Epigastric pain. EXAM: CT ABDOMEN AND PELVIS WITHOUT CONTRAST TECHNIQUE: Multidetector CT imaging of the abdomen and pelvis was performed following the standard protocol without IV contrast. COMPARISON:  Abdominal ultrasound examination from today. FINDINGS: Lower chest: The lung bases are clear of an acute process. Minimal dependent subpleural atelectasis. The heart is normal in size. No pericardial effusion. Hepatobiliary: No focal hepatic lesions or intrahepatic biliary dilatation without contrast. Gallbladder is mildly distended and there are several gallstones noted. No significant pericholecystic inflammatory change or fluid. No common bile duct dilatation. Pancreas: The pancreas is inflamed/edematous and there is significant surrounding fluid and likely inflammatory phlegmon consistent with acute pancreatitis. Moderate fluid noted in the anterior pararenal spaces bilaterally. Spleen: Normal size.  No focal lesions. Adrenals/Urinary Tract: The adrenal glands and kidneys are unremarkable. No renal, ureteral or bladder calculi. Stomach/Bowel: The stomach is unremarkable. There is moderate inflammation surrounding the second and third  portions of the duodenum. No findings for obstruction. Contrast gets into the distal small bowel. The small bowel and colon are grossly normal. Vascular/Lymphatic: The aorta is normal in caliber. No atheroscerlotic calcifications. No mesenteric of retroperitoneal mass or adenopathy. Small scattered lymph nodes are noted. Reproductive: The prostate gland and seminal vesicles are unremarkable. Other: Moderate free pelvic fluid. No pelvic adenopathy. No inguinal adenopathy or hernia. Musculoskeletal: No significant bony findings. IMPRESSION: 1. Acute pancreatitis with moderate pancreatic and peripancreatic inflammatory changes and free fluid in the anterior pararenal spaces and also in the pelvis. Without contrast no comment can be made about pancreatic necrosis. 2. Cholelithiasis without definite CT findings for acute cholecystitis. 3. Inflammation of the second and third portions of the duodenum due to the pancreatitis. Electronically Signed   By: Rudie Meyer M.D.   On: 07/11/2018 21:34   Dg Chest 2 View  Result Date: 07/11/2018 CLINICAL DATA:  Chest pain EXAM: CHEST - 2 VIEW COMPARISON:  06/15/2014 FINDINGS: The heart size and mediastinal contours are within normal limits. Both lungs are clear. The visualized skeletal structures are unremarkable. IMPRESSION: No active cardiopulmonary disease. Electronically Signed   By: Deatra Robinson M.D.   On: 07/11/2018 17:57   US Abdomen Limited  Result Date: 07/11/2018 CLINICAL DATA:  Right upper quadrant abdominal pain with nausea and vomiting. EXAM: ULTRASOUND ABDOMEN LIMITED RIGHT UPPER QUADRANT COMPARISON:  None. FINDINGS: Gallbladder: Numerous gallstones are noted the gallbladder. The largest calculus measures 1.4 cm. No gallbladder wall thickening or pericholecystic fluid. Equivocal sonographic Murphy sign. Common bile duct: Diameter: 3.6 mm Liver: Normal echogenicity without focal lesion or biliary dilatation. Portal vein is patent on color Doppler imaging with  normal direction of blood flow towards the liver. IMPRESSION: 1. Cholelithiasis without sonographic findings for acute cholecystitis other than an equivocal sonographic Murphy sign. 2. Normal caliber common bile duct and normal liver. Electronically Signed   By: Rudie Meyer M.D.   On: 07/11/2018 20:53  Anti-infectives: Anti-infectives (From admission, onward)   None      Assessment/Plan: Problem List: Patient Active Problem List   Diagnosis Date Noted  . Pancreatitis 07/12/2018  . Acute gallstone pancreatitis 07/11/2018  . Hypertensive urgency 07/11/2018  . Leukocytosis 07/11/2018  . Nonintractable headache 05/18/2018    Lipase still elevated.  WBC bumped to 27K.  This is more than transient gallstone pancreatitis.  Will follow clinically but if lab deteriorates may need CT with contrast to look for pancreatic necrosis.   * No surgery found *    LOS: 1 day   Matt B. Daphine Deutscher, MD, [facility] [phone] beeper [phone]  07/13/2018 10:06 AM

## 2018-07-13 NOTE — Progress Notes (Signed)
PROGRESS NOTE    Harry BreedingMichael T Morales  ZOX:[id number] DOB: 01/06/1971 DOA: 07/11/2018 [facility]: [facility]    Brief Narrative:  [age] year old male who presented with epigastric abdominal pain, nausea and vomiting.  He has no significant past medical history.  Reported acute onset of epigastric abdominal pain associated with nausea and vomiting.  Symptoms were refractive to over-the-counter antiacids.  Physical examination his blood pressure was 180/100, heart rate 123 rate 21, temperature 97.5 F, oxygen saturation 100%.  Patient was noted to be uncomfortable, moist mucous membranes, lungs clear to auscultation bilaterally, heart S1-2 present rhythmic, abdomen tender to palpation in the epigastric, no rebound or guarding, no lower extremity edema.  Sodium 137, potassium 4.0, chloride 103, bicarb 22, glucose 140, BUN 10, creatinine 1.31, lipase 597, white count 17.7, hemoglobin 18.9, hematocrit 56.7, platelets 339.  Abdominal and pelvis CT showed acute pancreatitis with moderate pancreatic and peripancreatic inflammatory changes, free fluid anterior pararenal spaces and also in the pelvis.  No pancreatic necrosis.  Positive cholelithiasis without findings of cholecystitis.  Inflammation of the second and third portion of the duodenum due to acute pancreatitis.  Chest x-ray was negative for infiltrates.  Ultrasound of the abdomen with cholelithiasis without sonographic findings for acute cholecystitis.  Normal caliber common bile duct with normal liver.  EKG with normal sinus rhythm, normal axis and normal intervals.  Patient was admitted to the hospital with the working diagnosis of acute gallstone pancreatitis   Assessment & Plan:   Principal Problem:   Acute gallstone pancreatitis Active Problems:   Hypertensive urgency   Leukocytosis   Pancreatitis   1. Acute gallstone pancreatitis. Patient has a BISAP score of one, consistent in increased mortality risk. Will continue supportive  medical therapy, pain still present, with no nausea or vomiting, will continue IV fluids (increase rate to 100 ml per H), IV antiacids and as needed antiemetics and analgesics. Keep NPO for now. Positive tachycardia and SIRS syndrome (no sepsis).   2. Gallstones. Follow with surgery recommendations, in regards of cholecystectomy. Plan for procedure on this admission.   3. Reactive leukocytosis. Continue with worsening leukocytosis up to 27 today, no signs of pancreatic necrosis from Ct of the abdomen. If worsening leukocytosis in am, fevers or worsening pain will repeat pancreatic imaging to rule out necrosis. For now will hold on antibiotic therapy.  4. AKI with hypernatremia and hyperchloremia. Renal function with serum cr of 1,0 today with K of 3,7, Na 136 and Cl 99 with serum bicarbonate at 25. Will increase IV fluids to 100 ml of hypotonic saline to prevent worsening hypernatremia and hyperchloremia.   5. Hypertension. Systolic blood pressure 160, will continue pain control and as needed IV hydralazine.   DVT prophylaxis: enoxaparin   Code Status: full Family Communication: no family at the bedside  Disposition Plan/ discharge barriers: pending clinical improvement.   Body mass index is 22.65 kg/m. Malnutrition Type:      Malnutrition Characteristics:      Nutrition Interventions:     RN Pressure Injury Documentation:     Consultants:   Surgery   Procedures:     Antimicrobials:       Subjective: Patient continues to have abdominal pain, it has improved but not back yet to baseline, no nausea or vomiting, has been npo since admission.   Objective: Vitals:   07/12/18 2317 07/13/18 0000 07/13/18 0100 07/13/18 0448  BP: (!) 156/99 (!) 140/96 (!) 147/95 (!) 149/90  Pulse: (!) 113 (!) 114 (!) 120 Marland Kitchen(!)  111  Resp: 16 18 18 20   Temp: 99.3 F (37.4 C) 99.9 F (37.7 C) 99.9 F (37.7 C) 99.5 F (37.5 C)  TempSrc:    Oral  SpO2: 99% 99% 99% 96%  Weight:        Height:        Intake/Output Summary (Last 24 hours) at 07/13/2018 0826 Last data filed at 07/12/2018 1500 Gross per 24 hour  Intake 1173.97 ml  Output -  Net 1173.97 ml   Filed Weights   07/11/18 1633 07/11/18 2028 07/11/18 2300  Weight: 59 kg 59 kg 58 kg    Examination:   General: Not in pain or dyspnea.  Neurology: Awake and alert, non focal  E ENT: mild pallor, no icterus, oral mucosa moist Cardiovascular: No JVD. S1-S2 present, rhythmic, no gallops, rubs, or murmurs. No lower extremity edema. Pulmonary: positive breath sounds bilaterally, adequate air movement, no wheezing, rhonchi or rales. Gastrointestinal. Abdomen with mild distention,  no organomegaly, no rebound or guarding Skin. No rashes Musculoskeletal: no joint deformities     Data Reviewed: I have personally reviewed following labs and imaging studies  CBC: Recent Labs  Lab 07/11/18 1655 07/12/18 0210 07/13/18 0343  WBC 17.7* 19.3* 27.7*  NEUTROABS  --  17.5* 24.9*  HGB 18.9* 18.6* 15.8  HCT 56.7* 55.7* 46.7  MCV 85.4 86.4 87.5  PLT 339 312 234   Basic Metabolic Panel: Recent Labs  Lab 07/11/18 1655 07/12/18 0210 07/13/18 0343  NA 137 142 136  K 4.0 4.3 3.7  CL 103 105 99  CO2 22 23 25   GLUCOSE 140* 129* 120*  BUN 10 11 7   CREATININE 1.31* 1.15 1.07  CALCIUM 10.1 8.8* 8.7*   GFR: Estimated Creatinine Clearance: 67.9 mL/min (by C-G formula based on SCr of 1.07 mg/dL). Liver Function Tests: Recent Labs  Lab 07/11/18 2058 07/12/18 0210 07/13/18 0343  AST 235* 85* 34  ALT 251* 165* 73*  ALKPHOS 92 80 60  BILITOT 1.9* 0.9 1.1  PROT 7.5 6.6 6.7  ALBUMIN 4.6 4.0 3.3*   Recent Labs  Lab 07/11/18 2058 07/12/18 0210 07/13/18 0343  LIPASE 597* 273* 171*   No results for input(s): AMMONIA in the last 168 hours. Coagulation Profile: No results for input(s): INR, PROTIME in the last 168 hours. Cardiac Enzymes: No results for input(s): CKTOTAL, CKMB, CKMBINDEX, TROPONINI in the last  168 hours. BNP (last 3 results) No results for input(s): PROBNP in the last 8760 hours. HbA1C: No results for input(s): HGBA1C in the last 72 hours. CBG: Recent Labs  Lab 07/12/18 0748 07/13/18 0753  GLUCAP 126* 110*   Lipid Profile: Recent Labs    07/12/18 0210  TRIG 57   Thyroid Function Tests: No results for input(s): TSH, T4TOTAL, FREET4, T3FREE, THYROIDAB in the last 72 hours. Anemia Panel: No results for input(s): VITAMINB12, FOLATE, FERRITIN, TIBC, IRON, RETICCTPCT in the last 72 hours.    Radiology Studies: I have reviewed all of the imaging during this hospital visit personally     Scheduled Meds: . enoxaparin (LOVENOX) injection  40 mg Subcutaneous Daily   Continuous Infusions: . sodium chloride 75 mL/hr at 07/13/18 0302  . famotidine (PEPCID) IV 20 mg (07/12/18 1625)     LOS: 1 day         Annett Gula, MD

## 2018-07-13 NOTE — Progress Notes (Signed)
NP Blount arrived bedside and assessed patient. No distress at this time, current orders remain the same. Will closely monitor patient throughout the night and follow through with the MEWS RED protocol.

## 2018-07-13 NOTE — Plan of Care (Signed)
  Problem: Education: Goal: Knowledge of General Education information will improve Description Including pain rating scale, medication(s)/side effects and non-pharmacologic comfort measures Outcome: Progressing   

## 2018-07-14 LAB — CBC WITH DIFFERENTIAL/PLATELET
Abs Immature Granulocytes: 0.18 10*3/uL — ABNORMAL HIGH (ref 0.00–0.07)
BASOS ABS: 0 10*3/uL (ref 0.0–0.1)
Basophils Relative: 0 %
Eosinophils Absolute: 0.1 10*3/uL (ref 0.0–0.5)
Eosinophils Relative: 0 %
HCT: 40.2 % (ref 39.0–52.0)
Hemoglobin: 13.4 g/dL (ref 13.0–17.0)
Immature Granulocytes: 1 %
Lymphocytes Relative: 4 %
Lymphs Abs: 0.7 10*3/uL (ref 0.7–4.0)
MCH: 29 pg (ref 26.0–34.0)
MCHC: 33.3 g/dL (ref 30.0–36.0)
MCV: 87 fL (ref 80.0–100.0)
Monocytes Absolute: 1.2 10*3/uL — ABNORMAL HIGH (ref 0.1–1.0)
Monocytes Relative: 7 %
NRBC: 0 % (ref 0.0–0.2)
Neutro Abs: 15.8 10*3/uL — ABNORMAL HIGH (ref 1.7–7.7)
Neutrophils Relative %: 88 %
PLATELETS: 183 10*3/uL (ref 150–400)
RBC: 4.62 MIL/uL (ref 4.22–5.81)
RDW: 13.8 % (ref 11.5–15.5)
WBC: 18 10*3/uL — ABNORMAL HIGH (ref 4.0–10.5)

## 2018-07-14 LAB — BASIC METABOLIC PANEL
Anion gap: 10 (ref 5–15)
BUN: 7 mg/dL (ref 6–20)
CO2: 24 mmol/L (ref 22–32)
Calcium: 8 mg/dL — ABNORMAL LOW (ref 8.9–10.3)
Chloride: 99 mmol/L (ref 98–111)
Creatinine, Ser: 1.09 mg/dL (ref 0.61–1.24)
GFR calc Af Amer: >60 mL/min (ref >60)
GFR calc non Af Amer: >60 mL/min (ref >60)
Glucose, Bld: 95 mg/dL (ref 70–99)
Potassium: 3.8 mmol/L (ref 3.5–5.1)
Sodium: 133 mmol/L — ABNORMAL LOW (ref 135–145)

## 2018-07-14 LAB — GLUCOSE, CAPILLARY: Glucose-Capillary: 93 mg/dL (ref 70–99)

## 2018-07-14 MED ORDER — LACTATED RINGERS IV SOLN
INTRAVENOUS | Status: DC
  Administered 2018-07-14 – 2018-07-16 (×3): via INTRAVENOUS

## 2018-07-14 NOTE — Progress Notes (Signed)
Patient ID: Harry Morales, male   DOB: [birth year], 48 y.o.   MRN: [id number] [facility] Surgery Progress Note:   * No surgery found *  Subjective: Mental status is clear;  Feels better but still with pain radiating into the back Objective: Vital signs in last 24 hours: Temp:  [98 F (36.7 C)-99.4 F (37.4 C)] 99.4 F (37.4 C) (02/02 0526) Pulse Rate:  [108-115] 108 (02/02 0526) Resp:  [18] 18 (02/02 0526) BP: (119-154)/(70-97) 119/70 (02/02 0526) SpO2:  [92 %-95 %] 92 % (02/02 0526)  Intake/Output from previous day: 02/01 0701 - 02/02 0700 In: 2133.8 [I.V.:2033.8; IV Piggyback:100] Out: -  Intake/Output this shift: No intake/output data recorded.  Physical Exam: Work of breathing is normal.  Abdomen is mildly distended - pain less  Lab Results:  Results for orders placed or performed during the hospital encounter of 07/11/18 (from the past 48 hour(s))  CBC with Differential/Platelet     Status: Abnormal   Collection Time: 07/13/18  3:43 AM  Result Value Ref Range   WBC 27.7 (H) 4.0 - 10.5 K/uL    Comment: WHITE COUNT CONFIRMED ON SMEAR   RBC 5.34 4.22 - 5.81 MIL/uL   Hemoglobin 15.8 13.0 - 17.0 g/dL   HCT 83.4 19.6 - 22.2 %   MCV 87.5 80.0 - 100.0 fL   MCH 29.6 26.0 - 34.0 pg   MCHC 33.8 30.0 - 36.0 g/dL   RDW 97.9 89.2 - 11.9 %   Platelets 234 150 - 400 K/uL   nRBC 0.0 0.0 - 0.2 %   Neutrophils Relative % 90 %   Lymphocytes Relative 4 %   Monocytes Relative 6 %   Eosinophils Relative 0 %   Basophils Relative 0 %   Band Neutrophils 0 %   Metamyelocytes Relative 0 %   Myelocytes 0 %   Promyelocytes Relative 0 %   Blasts 0 %   nRBC 0 0 /100 WBC   Other 0 %   Neutro Abs 24.9 (H) 1.7 - 7.7 K/uL   Lymphs Abs 1.1 0.7 - 4.0 K/uL   Monocytes Absolute 1.7 (H) 0.1 - 1.0 K/uL   Eosinophils Absolute 0.0 0.0 - 0.5 K/uL   Basophils Absolute 0.0 0.0 - 0.1 K/uL   WBC Morphology MILD LEFT SHIFT (1-5% METAS, OCC MYELO, OCC BANDS)     Comment: VACUOLATED  NEUTROPHILS Performed at [facility], [address] [place], [place] [address]   Comprehensive metabolic panel     Status: Abnormal   Collection Time: 07/13/18  3:43 AM  Result Value Ref Range   Sodium 136 135 - 145 mmol/L   Potassium 3.7 3.5 - 5.1 mmol/L   Chloride 99 98 - 111 mmol/L   CO2 25 22 - 32 mmol/L   Glucose, Bld 120 (H) 70 - 99 mg/dL   BUN 7 6 - 20 mg/dL   Creatinine, Ser 8.14 0.61 - 1.24 mg/dL   Calcium 8.7 (L) 8.9 - 10.3 mg/dL   Total Protein 6.7 6.5 - 8.1 g/dL   Albumin 3.3 (L) 3.5 - 5.0 g/dL   AST 34 15 - 41 U/L   ALT 73 (H) 0 - 44 U/L   Alkaline Phosphatase 60 38 - 126 U/L   Total Bilirubin 1.1 0.3 - 1.2 mg/dL   GFR calc non Af Amer >60 >60 mL/min   GFR calc Af Amer >60 >60 mL/min   Anion gap 12 5 - 15    Comment: Performed at [employer]  [facility], [address] [place], [place] [address]  Lipase, blood     Status: Abnormal   Collection Time: 07/13/18  3:43 AM  Result Value Ref Range   Lipase 171 (H) 11 - 51 U/L    Comment: Performed at [facility], [address], [place], [place] [address]  Glucose, capillary     Status: Abnormal   Collection Time: 07/13/18  7:53 AM  Result Value Ref Range   Glucose-Capillary 110 (H) 70 - 99 mg/dL  Basic metabolic panel     Status: Abnormal   Collection Time: 07/14/18  4:45 AM  Result Value Ref Range   Sodium 133 (L) 135 - 145 mmol/L   Potassium 3.8 3.5 - 5.1 mmol/L   Chloride 99 98 - 111 mmol/L   CO2 24 22 - 32 mmol/L   Glucose, Bld 95 70 - 99 mg/dL   BUN 7 6 - 20 mg/dL   Creatinine, Ser 2.63 0.61 - 1.24 mg/dL   Calcium 8.0 (L) 8.9 - 10.3 mg/dL   GFR calc non Af Amer >60 >60 mL/min   GFR calc Af Amer >60 >60 mL/min   Anion gap 10 5 - 15    Comment: Performed at [facility], [address], [place], [place] [address]  CBC with Differential/Platelet     Status: Abnormal   Collection Time: 07/14/18  4:45 AM  Result Value Ref Range   WBC 18.0 (H) 4.0 - 10.5 K/uL    Comment: WHITE COUNT  CONFIRMED ON SMEAR   RBC 4.62 4.22 - 5.81 MIL/uL   Hemoglobin 13.4 13.0 - 17.0 g/dL   HCT 50.2 77.4 - 12.8 %   MCV 87.0 80.0 - 100.0 fL   MCH 29.0 26.0 - 34.0 pg   MCHC 33.3 30.0 - 36.0 g/dL   RDW 78.6 76.7 - 20.9 %   Platelets 183 150 - 400 K/uL   nRBC 0.0 0.0 - 0.2 %   Neutrophils Relative % 88 %   Neutro Abs 15.8 (H) 1.7 - 7.7 K/uL   Lymphocytes Relative 4 %   Lymphs Abs 0.7 0.7 - 4.0 K/uL   Monocytes Relative 7 %   Monocytes Absolute 1.2 (H) 0.1 - 1.0 K/uL   Eosinophils Relative 0 %   Eosinophils Absolute 0.1 0.0 - 0.5 K/uL   Basophils Relative 0 %   Basophils Absolute 0.0 0.0 - 0.1 K/uL   Immature Granulocytes 1 %   Abs Immature Granulocytes 0.18 (H) 0.00 - 0.07 K/uL    Comment: Performed at [facility], [address], [place], [place] [address]    Radiology/Results: No results found.  Anti-infectives: Anti-infectives (From admission, onward)   None      Assessment/Plan: Problem List: Patient Active Problem List   Diagnosis Date Noted  . Pancreatitis 07/12/2018  . Acute gallstone pancreatitis 07/11/2018  . Hypertensive urgency 07/11/2018  . Leukocytosis 07/11/2018  . Nonintractable headache 05/18/2018    WBC down.  Lipase pending.  Will recheck lab.  Lap chole likely this week.   * No surgery found *    LOS: 2 days   Matt B. Daphine Deutscher, MD, [facility] [phone] beeper [phone]  07/14/2018 7:58 AM

## 2018-07-14 NOTE — Progress Notes (Signed)
PROGRESS NOTE    Harry BreedingMichael T Majano  ZOX:[id number] DOB: 10/13/1970 DOA: 07/11/2018 [facility]: [facility]    Brief Narrative:  48 year old male who presented with epigastric abdominal pain, nausea and vomiting. He has no significant past medical history. Reported acute onset of epigastric abdominal pain associated with nausea and vomiting. Symptoms were refractive to over-the-counter antiacids. Physical examination his blood pressure was 180/100, heart rate 123 rate 21, temperature 97.5 F, oxygen saturation 100%. Patient was noted to be uncomfortable, moist mucous membranes, lungs clear to auscultation bilaterally, heart S1-2 present rhythmic, abdomen tender to palpation in the epigastric, no rebound or guarding, no lower extremity edema. Sodium 137, potassium 4.0,chloride 103, bicarb 22, glucose 140, BUN 10, creatinine 1.31, lipase 597,white count 17.7, hemoglobin 18.9, hematocrit 56.7, platelets 339.Abdominal and pelvis CT showed acute pancreatitis with moderate pancreatic and peripancreatic inflammatory changes, free fluid anterior pararenal spaces and also in the pelvis. No pancreatic necrosis. Positive cholelithiasis without findings of cholecystitis. Inflammation of the second and third portion of the duodenum due to acute pancreatitis. Chest x-ray was negative for infiltrates. Ultrasound of the abdomen with cholelithiasis without sonographic findings for acute cholecystitis. Normal caliber common bile duct with normal liver.EKG with normal sinus rhythm,normal axisandnormal intervals.  Patient was admitted to the hospital with the working diagnosis of acute gallstone pancreatitis   Assessment & Plan:   Principal Problem:   Acute gallstone pancreatitis Active Problems:   Hypertensive urgency   Leukocytosis   Pancreatitis   1. Acute gallstone pancreatitis. Patient has a BISAP score of one, consistent in increased mortality risk. Continue pain control with  IV analgesics, will advance die to clears today and will continue IV fluids with balanced electrolyte solutions, wbc is trending down and patient has remained afebrile. HR has remained at 100 bpm.   2. Gallstones with no cholecystitis. Will keep patient NPO after midnight, for possible cholecystectomy in am.  3. Reactive leukocytosis. WBC trending down to 18 from 27, likely reactive to acute pancreatitis, imaging with no pancreatic necrosis. Will continue to hold on antibiotic therapy and will follow on cell count in am.   4. AKI with hypernatremia and hyperchloremia. Stable renal function with serum cr at 1,09, Na now down to 133 and Cl 99 with serum bicarbonate at 24, will continue hydration with balanced electrolyte solutions with LR.   5. Hypertension. Improved blood pressure with systolic 119 mmHg this am. Likely reactive hypertension due to acute illness, continue to hold on oral antihypertensives for now.   DVT prophylaxis:enoxaparin Code Status:full Family Communication:no family at the bedside Disposition Plan/ discharge barriers:pending clinical improvement  Body mass index is 22.65 kg/m. Malnutrition Type:      Malnutrition Characteristics:      Nutrition Interventions:     RN Pressure Injury Documentation:     Consultants:   Surgery   Procedures:     Antimicrobials:       Subjective: Abdominal pain improved with analgesics, no nausea or vomiting, no chest pain or dyspnea, not back to baseline.   Objective: Vitals:   07/13/18 0448 07/13/18 1352 07/13/18 2140 07/14/18 0526  BP: (!) 149/90 (!) 154/97 138/87 119/70  Pulse: (!) 111 (!) 115 (!) 108 (!) 108  Resp: 20 18 18 18   Temp: 99.5 F (37.5 C) 98 F (36.7 C) 99.3 F (37.4 C) 99.4 F (37.4 C)  TempSrc: Oral Oral Oral Oral  SpO2: 96% 95% 95% 92%  Weight:      Height:  Intake/Output Summary (Last 24 hours) at 07/14/2018 1035 Last data filed at 07/14/2018 0900 Gross per 24  hour  Intake 2133.77 ml  Output -  Net 2133.77 ml   Filed Weights   07/11/18 1633 07/11/18 2028 07/11/18 2300  Weight: 59 kg 59 kg 58 kg    Examination:   General: Not in pain or dyspnea, deconditioned  Neurology: Awake and alert, non focal  E ENT: mild pallor, no icterus, oral mucosa moist Cardiovascular: No JVD. S1-S2 present, rhythmic, no gallops, rubs, or murmurs. No lower extremity edema. Pulmonary: positive breath sounds bilaterally, adequate air movement, no wheezing, rhonchi or rales. Gastrointestinal. Abdomen distended and tender to deep palpation with no organomegaly. No rebound or guarding Skin. No rashes Musculoskeletal: no joint deformities     Data Reviewed: I have personally reviewed following labs and imaging studies  CBC: Recent Labs  Lab 07/11/18 1655 07/12/18 0210 07/13/18 0343 07/14/18 0445  WBC 17.7* 19.3* 27.7* 18.0*  NEUTROABS  --  17.5* 24.9* 15.8*  HGB 18.9* 18.6* 15.8 13.4  HCT 56.7* 55.7* 46.7 40.2  MCV 85.4 86.4 87.5 87.0  PLT 339 312 234 183   Basic Metabolic Panel: Recent Labs  Lab 07/11/18 1655 07/12/18 0210 07/13/18 0343 07/14/18 0445  NA 137 142 136 133*  K 4.0 4.3 3.7 3.8  CL 103 105 99 99  CO2 22 23 25 24   GLUCOSE 140* 129* 120* 95  BUN 10 11 7 7   CREATININE 1.31* 1.15 1.07 1.09  CALCIUM 10.1 8.8* 8.7* 8.0*   GFR: Estimated Creatinine Clearance: 66.7 mL/min (by C-G formula based on SCr of 1.09 mg/dL). Liver Function Tests: Recent Labs  Lab 07/11/18 2058 07/12/18 0210 07/13/18 0343  AST 235* 85* 34  ALT 251* 165* 73*  ALKPHOS 92 80 60  BILITOT 1.9* 0.9 1.1  PROT 7.5 6.6 6.7  ALBUMIN 4.6 4.0 3.3*   Recent Labs  Lab 07/11/18 2058 07/12/18 0210 07/13/18 0343  LIPASE 597* 273* 171*   No results for input(s): AMMONIA in the last 168 hours. Coagulation Profile: No results for input(s): INR, PROTIME in the last 168 hours. Cardiac Enzymes: No results for input(s): CKTOTAL, CKMB, CKMBINDEX, TROPONINI in the  last 168 hours. BNP (last 3 results) No results for input(s): PROBNP in the last 8760 hours. HbA1C: No results for input(s): HGBA1C in the last 72 hours. CBG: Recent Labs  Lab 07/12/18 0748 07/13/18 0753 07/14/18 0756  GLUCAP 126* 110* 93   Lipid Profile: Recent Labs    07/12/18 0210  TRIG 57   Thyroid Function Tests: No results for input(s): TSH, T4TOTAL, FREET4, T3FREE, THYROIDAB in the last 72 hours. Anemia Panel: No results for input(s): VITAMINB12, FOLATE, FERRITIN, TIBC, IRON, RETICCTPCT in the last 72 hours.    Radiology Studies: I have reviewed all of the imaging during this hospital visit personally     Scheduled Meds: . enoxaparin (LOVENOX) injection  40 mg Subcutaneous Daily   Continuous Infusions: . famotidine (PEPCID) IV Stopped (07/13/18 1435)  . lactated ringers       LOS: 2 days         Annett Gulaaniel , MD

## 2018-07-14 NOTE — Plan of Care (Signed)

## 2018-07-15 ENCOUNTER — Inpatient Hospital Stay (HOSPITAL_COMMUNITY): Payer: 59 | Admitting: Anesthesiology

## 2018-07-15 ENCOUNTER — Encounter (HOSPITAL_COMMUNITY)
Admission: EM | Disposition: A | Discharge status: Home / Self Care | Payer: Self-pay | Source: Home / Self Care | Attending: Internal Medicine

## 2018-07-15 ENCOUNTER — Inpatient Hospital Stay (HOSPITAL_COMMUNITY): Payer: 59

## 2018-07-15 ENCOUNTER — Encounter (HOSPITAL_COMMUNITY): Payer: Self-pay

## 2018-07-15 HISTORY — PX: CHOLECYSTECTOMY: SHX55

## 2018-07-15 LAB — BASIC METABOLIC PANEL
Anion gap: 9 (ref 5–15)
BUN: 8 mg/dL (ref 6–20)
CHLORIDE: 99 mmol/L (ref 98–111)
CO2: 28 mmol/L (ref 22–32)
CREATININE: 1.01 mg/dL (ref 0.61–1.24)
Calcium: 7.8 mg/dL — ABNORMAL LOW (ref 8.9–10.3)
GFR calc non Af Amer: >60 mL/min (ref >60)
Glucose, Bld: 105 mg/dL — ABNORMAL HIGH (ref 70–99)
Potassium: 3.5 mmol/L (ref 3.5–5.1)
Sodium: 136 mmol/L (ref 135–145)

## 2018-07-15 LAB — CBC WITH DIFFERENTIAL/PLATELET
Abs Immature Granulocytes: 0.1 10*3/uL — ABNORMAL HIGH (ref 0.00–0.07)
Basophils Absolute: 0 10*3/uL (ref 0.0–0.1)
Basophils Relative: 0 %
Eosinophils Absolute: 0 10*3/uL (ref 0.0–0.5)
Eosinophils Relative: 0 %
HCT: 36.4 % — ABNORMAL LOW (ref 39.0–52.0)
HEMOGLOBIN: 12.2 g/dL — AB (ref 13.0–17.0)
Immature Granulocytes: 1 %
Lymphocytes Relative: 5 %
Lymphs Abs: 0.7 10*3/uL (ref 0.7–4.0)
MCH: 29.3 pg (ref 26.0–34.0)
MCHC: 33.5 g/dL (ref 30.0–36.0)
MCV: 87.3 fL (ref 80.0–100.0)
MONO ABS: 1 10*3/uL (ref 0.1–1.0)
MONOS PCT: 8 %
Neutro Abs: 11.5 10*3/uL — ABNORMAL HIGH (ref 1.7–7.7)
Neutrophils Relative %: 86 %
Platelets: 197 10*3/uL (ref 150–400)
RBC: 4.17 MIL/uL — ABNORMAL LOW (ref 4.22–5.81)
RDW: 13.8 % (ref 11.5–15.5)
WBC: 13.3 10*3/uL — ABNORMAL HIGH (ref 4.0–10.5)
nRBC: 0 % (ref 0.0–0.2)

## 2018-07-15 LAB — GLUCOSE, CAPILLARY: GLUCOSE-CAPILLARY: 95 mg/dL (ref 70–99)

## 2018-07-15 SURGERY — LAPAROSCOPIC CHOLECYSTECTOMY WITH INTRAOPERATIVE CHOLANGIOGRAM
Anesthesia: General

## 2018-07-15 MED ORDER — ONDANSETRON HCL 4 MG/2ML IJ SOLN
INTRAMUSCULAR | Status: DC | PRN
  Administered 2018-07-15: 4 mg via INTRAVENOUS

## 2018-07-15 MED ORDER — PROPOFOL 10 MG/ML IV BOLUS
INTRAVENOUS | Status: DC | PRN
  Administered 2018-07-15: 150 mg via INTRAVENOUS

## 2018-07-15 MED ORDER — SUGAMMADEX SODIUM 200 MG/2ML IV SOLN
INTRAVENOUS | Status: DC | PRN
  Administered 2018-07-15: 150 mg via INTRAVENOUS

## 2018-07-15 MED ORDER — FENTANYL CITRATE (PF) 100 MCG/2ML IJ SOLN
25.0000 ug | INTRAMUSCULAR | Status: DC | PRN
  Administered 2018-07-15: 25 ug via INTRAVENOUS

## 2018-07-15 MED ORDER — SODIUM CHLORIDE 0.9 % IR SOLN
Status: DC | PRN
  Administered 2018-07-15: 1000 mL

## 2018-07-15 MED ORDER — OXYCODONE HCL 5 MG/5ML PO SOLN: 5.0000 mg | Freq: Once | ORAL | Status: DC | PRN

## 2018-07-15 MED ORDER — ACETAMINOPHEN 500 MG PO TABS
1000.0000 mg | ORAL_TABLET | Freq: Four times a day (QID) | ORAL | Status: DC
  Administered 2018-07-15 – 2018-07-16 (×4): 1000 mg via ORAL
  Filled 2018-07-15 (×4): qty 2

## 2018-07-15 MED ORDER — ROCURONIUM BROMIDE 50 MG/5ML IV SOSY
PREFILLED_SYRINGE | INTRAVENOUS | Status: DC | PRN
  Administered 2018-07-15: 50 mg via INTRAVENOUS

## 2018-07-15 MED ORDER — KETOROLAC TROMETHAMINE 30 MG/ML IJ SOLN
INTRAMUSCULAR | Status: AC
  Filled 2018-07-15: qty 1

## 2018-07-15 MED ORDER — KETOROLAC TROMETHAMINE 30 MG/ML IJ SOLN
INTRAMUSCULAR | Status: DC | PRN
  Administered 2018-07-15: 30 mg via INTRAVENOUS

## 2018-07-15 MED ORDER — PROPOFOL 10 MG/ML IV BOLUS
INTRAVENOUS | Status: AC
  Filled 2018-07-15: qty 20

## 2018-07-15 MED ORDER — FENTANYL CITRATE (PF) 100 MCG/2ML IJ SOLN
INTRAMUSCULAR | Status: AC
  Filled 2018-07-15: qty 2

## 2018-07-15 MED ORDER — PROMETHAZINE HCL 25 MG/ML IJ SOLN: 6.2500 mg | INTRAMUSCULAR | Status: DC | PRN

## 2018-07-15 MED ORDER — SODIUM CHLORIDE 0.9 % IV SOLN
INTRAVENOUS | Status: DC | PRN
  Administered 2018-07-15: 20 mL

## 2018-07-15 MED ORDER — PHENYLEPHRINE 40 MCG/ML (10ML) SYRINGE FOR IV PUSH (FOR BLOOD PRESSURE SUPPORT)
PREFILLED_SYRINGE | INTRAVENOUS | Status: AC
  Filled 2018-07-15: qty 10

## 2018-07-15 MED ORDER — DEXAMETHASONE SODIUM PHOSPHATE 10 MG/ML IJ SOLN
INTRAMUSCULAR | Status: DC | PRN
  Administered 2018-07-15: 4 mg via INTRAVENOUS

## 2018-07-15 MED ORDER — LIDOCAINE 2% (20 MG/ML) 5 ML SYRINGE
INTRAMUSCULAR | Status: AC
  Filled 2018-07-15: qty 5

## 2018-07-15 MED ORDER — FENTANYL CITRATE (PF) 250 MCG/5ML IJ SOLN
INTRAMUSCULAR | Status: AC
  Filled 2018-07-15: qty 5

## 2018-07-15 MED ORDER — ONDANSETRON HCL 4 MG/2ML IJ SOLN
INTRAMUSCULAR | Status: AC
  Filled 2018-07-15: qty 2

## 2018-07-15 MED ORDER — FENTANYL CITRATE (PF) 100 MCG/2ML IJ SOLN
INTRAMUSCULAR | Status: DC | PRN
  Administered 2018-07-15: 100 ug via INTRAVENOUS
  Administered 2018-07-15: 50 ug via INTRAVENOUS

## 2018-07-15 MED ORDER — OXYCODONE HCL 5 MG PO TABS: 5.0000 mg | ORAL_TABLET | Freq: Once | ORAL | Status: DC | PRN

## 2018-07-15 MED ORDER — ENOXAPARIN SODIUM 40 MG/0.4ML ~~LOC~~ SOLN
40.0000 mg | Freq: Every day | SUBCUTANEOUS | Status: DC
  Administered 2018-07-16: 40 mg via SUBCUTANEOUS
  Filled 2018-07-15: qty 0.4

## 2018-07-15 MED ORDER — IOPAMIDOL (ISOVUE-300) INJECTION 61%
INTRAVENOUS | Status: AC
  Filled 2018-07-15: qty 50

## 2018-07-15 MED ORDER — HYDROMORPHONE HCL 1 MG/ML IJ SOLN
0.5000 mg | INTRAMUSCULAR | Status: DC | PRN
  Administered 2018-07-16: 1 mg via INTRAVENOUS
  Filled 2018-07-15: qty 1

## 2018-07-15 MED ORDER — MIDAZOLAM HCL 2 MG/2ML IJ SOLN
INTRAMUSCULAR | Status: AC
  Filled 2018-07-15: qty 2

## 2018-07-15 MED ORDER — 0.9 % SODIUM CHLORIDE (POUR BTL) OPTIME
TOPICAL | Status: DC | PRN
  Administered 2018-07-15: 1000 mL

## 2018-07-15 MED ORDER — BUPIVACAINE HCL (PF) 0.25 % IJ SOLN
INTRAMUSCULAR | Status: AC
  Filled 2018-07-15: qty 30

## 2018-07-15 MED ORDER — IBUPROFEN 600 MG PO TABS
600.0000 mg | ORAL_TABLET | Freq: Three times a day (TID) | ORAL | Status: DC
  Administered 2018-07-15 – 2018-07-16 (×3): 600 mg via ORAL
  Filled 2018-07-15 (×3): qty 1

## 2018-07-15 MED ORDER — MIDAZOLAM HCL 5 MG/5ML IJ SOLN
INTRAMUSCULAR | Status: DC | PRN
  Administered 2018-07-15: 2 mg via INTRAVENOUS

## 2018-07-15 MED ORDER — LIDOCAINE 2% (20 MG/ML) 5 ML SYRINGE
INTRAMUSCULAR | Status: DC | PRN
  Administered 2018-07-15: 60 mg via INTRAVENOUS

## 2018-07-15 MED ORDER — FAMOTIDINE 20 MG PO TABS
20.0000 mg | ORAL_TABLET | Freq: Every day | ORAL | Status: DC
  Administered 2018-07-15 – 2018-07-16 (×2): 20 mg via ORAL
  Filled 2018-07-15 (×2): qty 1

## 2018-07-15 MED ORDER — CEFAZOLIN SODIUM-DEXTROSE 2-3 GM-%(50ML) IV SOLR
INTRAVENOUS | Status: DC | PRN
  Administered 2018-07-15: 2 g via INTRAVENOUS

## 2018-07-15 MED ORDER — OXYCODONE HCL 5 MG PO TABS
5.0000 mg | ORAL_TABLET | ORAL | Status: DC | PRN
  Administered 2018-07-15: 5 mg via ORAL
  Administered 2018-07-16: 10 mg via ORAL
  Administered 2018-07-16: 5 mg via ORAL
  Filled 2018-07-15 (×2): qty 1
  Filled 2018-07-15: qty 2

## 2018-07-15 MED ORDER — BUPIVACAINE HCL 0.25 % IJ SOLN
INTRAMUSCULAR | Status: DC | PRN
  Administered 2018-07-15: 30 mL

## 2018-07-15 MED ORDER — LACTATED RINGERS IV SOLN
INTRAVENOUS | Status: DC
  Administered 2018-07-15 (×2): via INTRAVENOUS

## 2018-07-15 SURGICAL SUPPLY — 44 items
ADH SKN CLS APL DERMABOND .7 (GAUZE/BANDAGES/DRESSINGS) ×1
APPLIER CLIP ROT 10 11.4 M/L (STAPLE)
APR CLP MED LRG 11.4X10 (STAPLE)
BAG SPEC RTRVL 10 TROC 200 (ENDOMECHANICALS) ×1
BLADE CLIPPER SURG (BLADE) IMPLANT
CANISTER SUCT 3000ML PPV (MISCELLANEOUS) ×2 IMPLANT
CATH CHOLANG 76X19 KUMAR (CATHETERS) ×2 IMPLANT
CHLORAPREP W/TINT 26ML (MISCELLANEOUS) ×2 IMPLANT
CLIP APPLIE ROT 10 11.4 M/L (STAPLE) IMPLANT
CLIP VESOLOCK MED LG 6/CT (CLIP) IMPLANT
COVER MAYO STAND STRL (DRAPES) ×2 IMPLANT
COVER SURGICAL LIGHT HANDLE (MISCELLANEOUS) ×2 IMPLANT
COVER WAND RF STERILE (DRAPES) ×2 IMPLANT
DERMABOND ADVANCED (GAUZE/BANDAGES/DRESSINGS) ×1
DERMABOND ADVANCED .7 DNX12 (GAUZE/BANDAGES/DRESSINGS) ×1 IMPLANT
DRAPE C-ARM 42X72 X-RAY (DRAPES) ×2 IMPLANT
ELECT REM PT RETURN 9FT ADLT (ELECTROSURGICAL) ×2
ELECTRODE REM PT RTRN 9FT ADLT (ELECTROSURGICAL) ×1 IMPLANT
GLOVE BIOGEL PI IND STRL 7.0 (GLOVE) ×1 IMPLANT
GLOVE BIOGEL PI INDICATOR 7.0 (GLOVE) ×1
GLOVE SURG SS PI 7.0 STRL IVOR (GLOVE) ×2 IMPLANT
GOWN STRL REUS W/ TWL LRG LVL3 (GOWN DISPOSABLE) ×3 IMPLANT
GOWN STRL REUS W/TWL LRG LVL3 (GOWN DISPOSABLE) ×6
GRASPER SUT TROCAR 14GX15 (MISCELLANEOUS) ×2 IMPLANT
KIT BASIN OR (CUSTOM PROCEDURE TRAY) ×2 IMPLANT
KIT TURNOVER KIT B (KITS) ×2 IMPLANT
NEEDLE 22X1 1/2 (OR ONLY) (NEEDLE) ×2 IMPLANT
NS IRRIG 1000ML POUR BTL (IV SOLUTION) ×2 IMPLANT
PAD ARMBOARD 7.5X6 YLW CONV (MISCELLANEOUS) ×2 IMPLANT
POUCH RETRIEVAL ECOSAC 10 (ENDOMECHANICALS) ×1 IMPLANT
POUCH RETRIEVAL ECOSAC 10MM (ENDOMECHANICALS) ×1
SCISSORS LAP 5X35 DISP (ENDOMECHANICALS) ×2 IMPLANT
SET IRRIG TUBING LAPAROSCOPIC (IRRIGATION / IRRIGATOR) ×2 IMPLANT
SET TUBE SMOKE EVAC HIGH FLOW (TUBING) ×2 IMPLANT
SLEEVE ENDOPATH XCEL 5M (ENDOMECHANICALS) ×4 IMPLANT
SPECIMEN JAR SMALL (MISCELLANEOUS) ×2 IMPLANT
STOPCOCK 4 WAY LG BORE MALE ST (IV SETS) ×2 IMPLANT
SUT MNCRL AB 4-0 PS2 18 (SUTURE) ×3 IMPLANT
TOWEL OR 17X24 6PK STRL BLUE (TOWEL DISPOSABLE) ×2 IMPLANT
TOWEL OR 17X26 10 PK STRL BLUE (TOWEL DISPOSABLE) ×2 IMPLANT
TRAY LAPAROSCOPIC MC (CUSTOM PROCEDURE TRAY) ×2 IMPLANT
TROCAR XCEL 12X100 BLDLESS (ENDOMECHANICALS) ×2 IMPLANT
TROCAR XCEL NON-BLD 5MMX100MML (ENDOMECHANICALS) ×2 IMPLANT
WATER STERILE IRR 1000ML POUR (IV SOLUTION) ×2 IMPLANT

## 2018-07-15 NOTE — Progress Notes (Signed)
returned to room 6n9 from PACU at this time.

## 2018-07-15 NOTE — Op Note (Addendum)
PATIENT:  [name]  48 y.o. male  PRE-OPERATIVE DIAGNOSIS:  cholecystitis  POST-OPERATIVE DIAGNOSIS:  cholecystitis  PROCEDURE:  Procedure(s): LAPAROSCOPIC CHOLECYSTECTOMY WITH INTRAOPERATIVE CHOLANGIOGRAM   SURGEON:  Surgeon(s): , De Blanch, MD  ASSISTANT: Leary Roca  ANESTHESIA:   local and general  Indications for procedure: Harry Morales is a 48 y.o. male with symptoms of Abdominal pain and Nausea and vomiting consistent with gallbladder disease, Confirmed by Ultrasound.  Description of procedure: The patient was brought into the operative suite, placed supine. Anesthesia was administered with endotracheal tube. Patient was strapped in place and foot board was secured. All pressure points were offloaded by foam padding. The patient was prepped and draped in the usual sterile fashion.  A small incision was made to the right of the umbilicus. A 44mm trocar was inserted into the peritoneal cavity with optical entry. Pneumoperitoneum was applied with high flow low pressure. 2 48mm trocars were placed in the RUQ. A 2mm trocar was placed in the subxiphoid space. Marcaine was infused to the subxiphoid space and lateral upper right abdomen in the transversus abdominis plane. Next the patient was placed in reverse trendelenberg. The gallbladder was distended, there was also edema of the duodenum and retroperitoneum.  The gallbladder was retracted cephalad and lateral. The peritoneum was reflected off the infundibulum working lateral to medial. The cystic duct and cystic artery were identified and further dissection revealed a critical view, due to concern for choledocholithiasis a cholangiogram was performed with Rolene Arbour. There was some leaking of the contrast, but visualization of the common bile duct and doudenal filling was identified without filling defect. The cystic duct and cystic artery were doubly clipped and ligated.   The gallbladder was removed off the  liver bed with cautery. The Gallbladder was placed in a specimen bag. The gallbladder fossa was irrigated and hemostasis was applied with cautery. The gallbladder was removed via the 58mm trocar. The fascial defect was closed with interrupted 0 vicryl suture via laparoscopic trans-fascial suture passer. Pneumoperitoneum was removed, all trocar were removed. All incisions were closed with 4-0 monocryl subcuticular stitch. The patient woke from anesthesia and was brought to PACU in stable condition. All counts were correct  Findings: multiple stones of the gallbladder, normal IOC, inflamed gallbladder, some peripancreatic edema  Specimen: gallbladder  Blood loss: 30 ml  Local anesthesia: 30 ml marcaine  Complications: none  PLAN OF CARE: Admit to inpatient   PATIENT DISPOSITION:  PACU - hemodynamically stable.   Feliciana Rossetti, M.D. General, Bariatric, & Minimally Invasive Surgery [facility]

## 2018-07-15 NOTE — Anesthesia Preprocedure Evaluation (Addendum)
Anesthesia Evaluation  Patient identified by MRN, date of birth, ID band Patient awake    Reviewed: Allergy & Precautions, NPO status , Patient's Chart, lab work & pertinent test results  History of Anesthesia Complications Negative for: history of anesthetic complications  Airway Mallampati: III  TM Distance: >3 FB Neck ROM: Full    Dental  (+) Dental Advisory Given, Edentulous Upper   Pulmonary neg pulmonary ROS,    breath sounds clear to auscultation       Cardiovascular (-) hypertension (denies) Rhythm:Regular Rate:Tachycardia     Neuro/Psych  Headaches, negative psych ROS   GI/Hepatic Neg liver ROS, GERD  Controlled,  Endo/Other  negative endocrine ROS  Renal/GU negative Renal ROS     Musculoskeletal negative musculoskeletal ROS (+)   Abdominal   Peds  Hematology  (+) anemia ,   Anesthesia Other Findings   Reproductive/Obstetrics                            Anesthesia Physical Anesthesia Plan  ASA: II  Anesthesia Plan: General   Post-op Pain Management:    Induction: Intravenous  PONV Risk Score and Plan: 4 or greater and Treatment may vary due to age or medical condition, Ondansetron, Dexamethasone, Midazolam and Scopolamine patch - Pre-op  Airway Management Planned: Oral ETT  Additional Equipment: None  Intra-op Plan:   Post-operative Plan: Extubation in OR  Informed Consent: I have reviewed the patients History and Physical, chart, labs and discussed the procedure including the risks, benefits and alternatives for the proposed anesthesia with the patient or authorized representative who has indicated his/her understanding and acceptance.     Dental advisory given  Plan Discussed with: CRNA and Anesthesiologist  Anesthesia Plan Comments:        Anesthesia Quick Evaluation

## 2018-07-15 NOTE — Progress Notes (Signed)
  Progress Note: General Surgery Service   Assessment/Plan: Principal Problem:   Acute gallstone pancreatitis Active Problems:   Hypertensive urgency   Leukocytosis   Pancreatitis  s/p Procedure(s): LAPAROSCOPIC CHOLECYSTECTOMY WITH INTRAOPERATIVE CHOLANGIOGRAM 07/15/2018 Pain improved, labs normalizing We discussed the etiology of her pain, we discussed treatment options and recommended surgery. We discussed details of surgery including general anesthesia, laparoscopic approach, identification of cystic duct and common bile duct. Ligation of cystic duct and cystic artery. Possible need for intraoperative cholangiogram or open procedure. Possible risks of common bile duct injury, liver injury, cystic duct leak, bleeding, infection, post-cholecystectomy syndrome. The patient showed good understanding and all questions were answered    LOS: 3 days  Chief Complaint/Subjective: Pain improved, no complaints  Objective: Vital signs in last 24 hours: Temp:  [98.5 F (36.9 C)-99.9 F (37.7 C)] 98.5 F (36.9 C) (02/03 0531) Pulse Rate:  [97-120] 97 (02/03 0531) Resp:  [18-22] 20 (02/03 0531) BP: (112-132)/(69-79) 112/69 (02/03 0531) SpO2:  [87 %-95 %] 94 % (02/03 0531) Last BM Date: 07/11/18  Intake/Output from previous day: 02/02 0701 - 02/03 0700 In: 1509.3 [P.O.:300; I.V.:1159.3; IV Piggyback:50] Out: -  Intake/Output this shift: No intake/output data recorded.  Lungs: nonlabored breathing  Cardiovascular: RRR  Abd: soft, slight pain mid abdomen  Extremities: no edema  Neuro: AOx4  Lab Results: CBC  Recent Labs    07/14/18 0445 07/15/18 0319  WBC 18.0* 13.3*  HGB 13.4 12.2*  HCT 40.2 36.4*  PLT 183 197   BMET Recent Labs    07/14/18 0445 07/15/18 0319  NA 133* 136  K 3.8 3.5  CL 99 99  CO2 24 28  GLUCOSE 95 105*  BUN 7 8  CREATININE 1.09 1.01  CALCIUM 8.0* 7.8*   PT/INR No results for input(s): LABPROT, INR in the last 72 hours. ABG No results for  input(s): PHART, HCO3 in the last 72 hours.  Invalid input(s): PCO2, PO2  Studies/Results:  Anti-infectives: Anti-infectives (From admission, onward)   None      Medications: Scheduled Meds: . enoxaparin (LOVENOX) injection  40 mg Subcutaneous Daily   Continuous Infusions: . famotidine (PEPCID) IV Stopped (07/14/18 1545)  . lactated ringers 75 mL/hr at 07/15/18 0300   PRN Meds:.acetaminophen **OR** acetaminophen, hydrALAZINE, HYDROmorphone (DILAUDID) injection, ondansetron (ZOFRAN) IV  Rodman Pickle, MD [facility]

## 2018-07-15 NOTE — Transfer of Care (Signed)
Immediate Anesthesia Transfer of Care Note  Patient: Harry Morales  Procedure(s) Performed: LAPAROSCOPIC CHOLECYSTECTOMY WITH INTRAOPERATIVE CHOLANGIOGRAM (N/A )  Patient Location: PACU  Anesthesia Type:General  Level of Consciousness: awake and drowsy  Airway & Oxygen Therapy: Patient Spontanous Breathing and Patient connected to nasal cannula oxygen  Post-op Assessment: Report given to RN and Post -op Vital signs reviewed and stable  Post vital signs: Reviewed and stable  Last Vitals:  Vitals Value Taken Time  BP 126/81 07/15/2018 12:34 PM  Temp    Pulse 112 07/15/2018 12:40 PM  Resp 23 07/15/2018 12:40 PM  SpO2 96 % 07/15/2018 12:40 PM  Vitals shown include unvalidated device data.  Last Pain:  Vitals:   07/15/18 0908  TempSrc: Oral  PainSc:       Patients Stated Pain Goal: 2 (07/15/18 0800)  Complications: No apparent anesthesia complications

## 2018-07-15 NOTE — Progress Notes (Signed)
PROGRESS NOTE    Harry Morales  HEN:[id number] DOB: Aug 19, 1970 DOA: 07/11/2018 PCP: [facility]    Brief Narrative:  48 year old male who presented with epigastric abdominal pain, nausea and vomiting. He has no significant past medical history. Reported acute onset of epigastric abdominal pain associated with nausea and vomiting. Symptoms were refractive to over-the-counter antiacids. Physical examination his blood pressure was 180/100, heart rate 123 rate 21, temperature 97.5 F, oxygen saturation 100%. Patient was noted to be uncomfortable, moist mucous membranes, lungs clear to auscultation bilaterally, heart S1-2 present rhythmic, abdomen tender to palpation in the epigastric, no rebound or guarding, no lower extremity edema. Sodium 137, potassium 4.0,chloride 103, bicarb 22, glucose 140, BUN 10, creatinine 1.31, lipase 597,white count 17.7, hemoglobin 18.9, hematocrit 56.7, platelets 339.Abdominal and pelvis CT showed acute pancreatitis with moderate pancreatic and peripancreatic inflammatory changes, free fluid anterior pararenal spaces and also in the pelvis. No pancreatic necrosis. Positive cholelithiasis without findings of cholecystitis. Inflammation of the second and third portion of the duodenum due to acute pancreatitis. Chest x-ray was negative for infiltrates. Ultrasound of the abdomen with cholelithiasis without sonographic findings for acute cholecystitis. Normal caliber common bile duct with normal liver.EKG with normal sinus rhythm,normal axisandnormal intervals.  Patient was admitted to the hospital with the working diagnosis of acute gallstone pancreatitis   Assessment & Plan:   Principal Problem:   Acute gallstone pancreatitis Active Problems:   Hypertensive urgency   Leukocytosis   Pancreatitis  1. Acute gallstone pancreatitis. Patient has a BISAP score of one, consistent in increased mortality risk.Patient is sp  cholecystectomy, will continue post op care per surgery recommendations, continue pain control IV fluids and advance diet as tolerated. Continue as needed antiemetics.   2. Gallstones with no cholecystitis.Patient is sp cholecystectomy today. Continue pos op care per surgery recommendations.   3. Reactive leukocytosis.Today's wbc down to 13,3, will continue to follow cell count in am, no signs of systemic infectin, continue to hold on antibiotic therapy.  4. AKI with hypernatremia and hyperchloremia. Stable electrolytes with Na at 136 and Cl at 99, renal function preserved with serum cr at 1,01, will continue hydration with LR, balanced electrolyte solutions.  5. Hypertension. Now controlled blood pressure with systolic 128 to 130, will continue pain control and blood pressure monitoring, no indication for medical therapy.   DVT prophylaxis:enoxaparin Code Status:full Family Communication:no family at the bedside Disposition Plan/ discharge barriers:pending final surgery recommendations. Body mass index is 22.65 kg/m. Malnutrition Type:      Malnutrition Characteristics:      Nutrition Interventions:     RN Pressure Injury Documentation:     Consultants:   Surgery   Procedures:   Cholecystectomy   Antimicrobials:       Subjective: Patient with abdominal pain, dull in nature, mild in intensity, with no radiation, improved with analgesics, no chest pain or dyspnea.   Objective: Vitals:   07/15/18 1234 07/15/18 1249 07/15/18 1304 07/15/18 1319  BP: 126/81 121/85 128/87 130/73  Pulse: (!) 127 (!) 111 (!) 108 (!) 111  Resp: (!) 25 (!) 24 (!) 25 16  Temp: (!) 97.5 F (36.4 C)   (!) 97.5 F (36.4 C)  TempSrc:      SpO2: 94% 97% 99% 98%  Weight:      Height:        Intake/Output Summary (Last 24 hours) at 07/15/2018 1401 Last data filed at 07/15/2018 1208 Gross per 24 hour  Intake 2209.27 ml  Output 5 ml  Net 2204.27 ml   Filed Weights    07/11/18 1633 07/11/18 2028 07/11/18 2300  Weight: 59 kg 59 kg 58 kg    Examination:   General: Not in pain or dyspnea, deconditioned  Neurology: Awake and alert, non focal  E ENT: mild pallor, no icterus, oral mucosa moist Cardiovascular: No JVD. S1-S2 present, rhythmic, no gallops, rubs, or murmurs. No lower extremity edema. Pulmonary: vesicular breath sounds bilaterally, adequate air movement, no wheezing, rhonchi or rales. Gastrointestinal. Abdomen with no organomegaly, non tender, no rebound or guarding. Mild distended, surgical wounds in place, clean.  Skin. No rashes Musculoskeletal: no joint deformities     Data Reviewed: I have personally reviewed following labs and imaging studies  CBC: Recent Labs  Lab 07/11/18 1655 07/12/18 0210 07/13/18 0343 07/14/18 0445 07/15/18 0319  WBC 17.7* 19.3* 27.7* 18.0* 13.3*  NEUTROABS  --  17.5* 24.9* 15.8* 11.5*  HGB 18.9* 18.6* 15.8 13.4 12.2*  HCT 56.7* 55.7* 46.7 40.2 36.4*  MCV 85.4 86.4 87.5 87.0 87.3  PLT 339 312 234 183 197   Basic Metabolic Panel: Recent Labs  Lab 07/11/18 1655 07/12/18 0210 07/13/18 0343 07/14/18 0445 07/15/18 0319  NA 137 142 136 133* 136  K 4.0 4.3 3.7 3.8 3.5  CL 103 105 99 99 99  CO2 22 23 25 24 28   GLUCOSE 140* 129* 120* 95 105*  BUN 10 11 7 7 8   CREATININE 1.31* 1.15 1.07 1.09 1.01  CALCIUM 10.1 8.8* 8.7* 8.0* 7.8*   GFR: Estimated Creatinine Clearance: 72 mL/min (by C-G formula based on SCr of 1.01 mg/dL). Liver Function Tests: Recent Labs  Lab 07/11/18 2058 07/12/18 0210 07/13/18 0343  AST 235* 85* 34  ALT 251* 165* 73*  ALKPHOS 92 80 60  BILITOT 1.9* 0.9 1.1  PROT 7.5 6.6 6.7  ALBUMIN 4.6 4.0 3.3*   Recent Labs  Lab 07/11/18 2058 07/12/18 0210 07/13/18 0343  LIPASE 597* 273* 171*   No results for input(s): AMMONIA in the last 168 hours. Coagulation Profile: No results for input(s): INR, PROTIME in the last 168 hours. Cardiac Enzymes: No results for input(s):  CKTOTAL, CKMB, CKMBINDEX, TROPONINI in the last 168 hours. BNP (last 3 results) No results for input(s): PROBNP in the last 8760 hours. HbA1C: No results for input(s): HGBA1C in the last 72 hours. CBG: Recent Labs  Lab 07/12/18 0748 07/13/18 0753 07/14/18 0756 07/15/18 0828  GLUCAP 126* 110* 93 95   Lipid Profile: No results for input(s): CHOL, HDL, LDLCALC, TRIG, CHOLHDL, LDLDIRECT in the last 72 hours. Thyroid Function Tests: No results for input(s): TSH, T4TOTAL, FREET4, T3FREE, THYROIDAB in the last 72 hours. Anemia Panel: No results for input(s): VITAMINB12, FOLATE, FERRITIN, TIBC, IRON, RETICCTPCT in the last 72 hours.    Radiology Studies: I have reviewed all of the imaging during this hospital visit personally     Scheduled Meds: . [MAR Hold] enoxaparin (LOVENOX) injection  40 mg Subcutaneous Daily  . [id number]       Continuous Infusions: . [MAR Hold] famotidine (PEPCID) IV Stopped (07/14/18 1545)  . lactated ringers 75 mL/hr at 07/15/18 0300  . lactated ringers 10 mL/hr at 07/15/18 [id number]     LOS: 3 days         Annett Gulaaniel , MD

## 2018-07-15 NOTE — Anesthesia Postprocedure Evaluation (Signed)
Anesthesia Post Note  Patient: Harry Morales  Procedure(s) Performed: LAPAROSCOPIC CHOLECYSTECTOMY WITH INTRAOPERATIVE CHOLANGIOGRAM (N/A )     Patient location during evaluation: PACU Anesthesia Type: General Level of consciousness: awake and alert Pain management: pain level controlled Vital Signs Assessment: post-procedure vital signs reviewed and stable Respiratory status: spontaneous breathing, nonlabored ventilation, respiratory function stable and patient connected to nasal cannula oxygen Cardiovascular status: stable, tachycardic and blood pressure returned to baseline Postop Assessment: no apparent nausea or vomiting Anesthetic complications: no    Last Vitals:  Vitals:   07/15/18 1304 07/15/18 1319  BP: 128/87 130/73  Pulse: (!) 108 (!) 111  Resp: (!) 25 16  Temp:  (!) 36.4 C  SpO2: 99% 98%    Last Pain:  Vitals:   07/15/18 1319  TempSrc:   PainSc: 4                  Beryle Lathe

## 2018-07-15 NOTE — Anesthesia Procedure Notes (Signed)
Procedure Name: Intubation Date/Time: 07/15/2018 11:13 AM Performed by: Inda Coke, CRNA Pre-anesthesia Checklist: Patient identified, Emergency Drugs available, Suction available and Patient being monitored Patient Re-evaluated:Patient Re-evaluated prior to induction Oxygen Delivery Method: Circle System Utilized Preoxygenation: Pre-oxygenation with 100% oxygen Induction Type: IV induction Ventilation: Mask ventilation without difficulty Laryngoscope Size: Mac and 4 Grade View: Grade I Tube type: Oral Tube size: 7.5 mm Number of attempts: 1 Airway Equipment and Method: Stylet and Oral airway Placement Confirmation: ETT inserted through vocal cords under direct vision,  positive ETCO2 and breath sounds checked- equal and bilateral Secured at: 18 cm Tube secured with: Tape Dental Injury: Teeth and Oropharynx as per pre-operative assessment

## 2018-07-15 NOTE — Progress Notes (Signed)
Received report from [place], Charity fundraiser. Patient's incisions clean, dry, intact. No complaints of pain at this time. Will continue to monitor.

## 2018-07-15 NOTE — Plan of Care (Signed)
  Problem: Education: Goal: Knowledge of General Education information will improve Description Including pain rating scale, medication(s)/side effects and non-pharmacologic comfort measures Outcome: Progressing   Problem: Health Behavior/Discharge Planning: Goal: Ability to manage health-related needs will improve Outcome: Progressing   

## 2018-07-16 ENCOUNTER — Encounter (HOSPITAL_COMMUNITY): Payer: Self-pay | Admitting: General Surgery

## 2018-07-16 LAB — CBC
HCT: 36 % — ABNORMAL LOW (ref 39.0–52.0)
Hemoglobin: 12 g/dL — ABNORMAL LOW (ref 13.0–17.0)
MCH: 29 pg (ref 26.0–34.0)
MCHC: 33.3 g/dL (ref 30.0–36.0)
MCV: 87 fL (ref 80.0–100.0)
Platelets: 259 10*3/uL (ref 150–400)
RBC: 4.14 MIL/uL — ABNORMAL LOW (ref 4.22–5.81)
RDW: 13.9 % (ref 11.5–15.5)
WBC: 12.6 10*3/uL — ABNORMAL HIGH (ref 4.0–10.5)
nRBC: 0 % (ref 0.0–0.2)

## 2018-07-16 LAB — COMPREHENSIVE METABOLIC PANEL
ALK PHOS: 118 U/L (ref 38–126)
ALT: 69 U/L — ABNORMAL HIGH (ref 0–44)
AST: 56 U/L — ABNORMAL HIGH (ref 15–41)
Albumin: 2.3 g/dL — ABNORMAL LOW (ref 3.5–5.0)
Anion gap: 10 (ref 5–15)
BILIRUBIN TOTAL: 0.6 mg/dL (ref 0.3–1.2)
BUN: 10 mg/dL (ref 6–20)
CO2: 27 mmol/L (ref 22–32)
Calcium: 8.3 mg/dL — ABNORMAL LOW (ref 8.9–10.3)
Chloride: 104 mmol/L (ref 98–111)
Creatinine, Ser: 1.06 mg/dL (ref 0.61–1.24)
GFR calc Af Amer: >60 mL/min (ref >60)
GFR calc non Af Amer: >60 mL/min (ref >60)
Glucose, Bld: 129 mg/dL — ABNORMAL HIGH (ref 70–99)
Potassium: 3.4 mmol/L — ABNORMAL LOW (ref 3.5–5.1)
Sodium: 141 mmol/L (ref 135–145)
TOTAL PROTEIN: 5.8 g/dL — AB (ref 6.5–8.1)

## 2018-07-16 LAB — GLUCOSE, CAPILLARY: Glucose-Capillary: 126 mg/dL — ABNORMAL HIGH (ref 70–99)

## 2018-07-16 MED ORDER — PANTOPRAZOLE SODIUM 40 MG PO TBEC: 40.0000 mg | DELAYED_RELEASE_TABLET | Freq: Every day | ORAL | 0 refills | Status: DC

## 2018-07-16 MED ORDER — IBUPROFEN 400 MG PO TABS: 400.0000 mg | ORAL_TABLET | Freq: Four times a day (QID) | ORAL | 0 refills | Status: DC | PRN

## 2018-07-16 MED ORDER — OXYCODONE HCL 5 MG PO TABS: 5.0000 mg | ORAL_TABLET | Freq: Four times a day (QID) | ORAL | 0 refills | Status: DC | PRN

## 2018-07-16 NOTE — Discharge Summary (Signed)
Physician Discharge Summary  Harry SallesMichael T Morales ZOX:[id number] DOB: 09/19/1970 DOA: 07/11/2018  [facility]  Admit date: 07/11/2018 Discharge date: 07/16/2018  Admitted From: Home  Disposition:  Home   Recommendations for Outpatient Follow-up and new medication changes:  1. [facility]  2. Patient placed on ibuprofen and hydrocodone for pain control  Home Health: no   Equipment/Devices: no    Discharge Condition: stable  CODE STATUS: full  Diet recommendation: regular diet.   Brief/Interim Summary: 48 year old male who presented with epigastric abdominal pain, nausea and vomiting. He has no significant past medical history. Reported acute onset of epigastric abdominal pain associated with nausea and vomiting. Symptoms were refractive to over-the-counter antiacids. On his initial physical examination his blood pressure was 180/100, heart rate 123 rate 21, temperature 97.5 F, oxygen saturation 100%. Patient was noted to be uncomfortable, moist mucous membranes, lungs clear to auscultation bilaterally, heart S1-2 present rhythmic, abdomen tender to palpation in the epigastric region, no rebound or guarding, no lower extremity edema. Sodium 137, potassium 4.0,chloride 103, bicarb 22, glucose 140, BUN 10, creatinine 1.31, lipase 597,white count 17.7, hemoglobin 18.9, hematocrit 56.7, platelets 339.Abdominal and pelvis CT showed acute pancreatitis with moderate pancreatic and peripancreatic inflammatory changes, free fluid anterior pararenal spaces and also in the pelvis. No pancreatic necrosis. Positive cholelithiasis without findings of cholecystitis. Inflammation of the second and third portion of the duodenum due to acute pancreatitis. Chest x-ray was negative for infiltrates. Ultrasound of the abdomen with cholelithiasis without sonographic findings for acute cholecystitis. Normal caliber common bile duct with normal liver.EKG  with normal sinus rhythm,normal axisandnormal intervals.  Patient was admitted to the hospital with the working diagnosis of acute gallstone pancreatitis  1.  Acute gallstone pancreatitis/ BISAP score of 1 consistent with increased risk of mortality.  Patient was admitted to the medical ward, he he was kept nothing by mouth, received IV fluids and IV antiacids, as needed IV antiemetics and IV analgesics.  His white cell count peaked at 27.7, he remained afebrile.  Patient was seen by surgery and underwent laparoscopic cholecystectomy with no major complications.  Diet was resumed with good toleration, patient will follow-up as an outpatient.  Continue pain control with hydrocodone, and ibuprofen, placed on pantoprazole for GI prophylaxis.  2.  Gallstones with no cholecystitis.  Patient underwent laparoscopic cholecystectomy with no major complications.  3.  Acute kidney injury with hypernatremia and hyperchloremia.  He received intravenous fluids with good toleration no signs of volume overload, his electrolytes were corrected, discharge sodium 141, potassium 3.4, chloride 104, bicarb 27, creatinine 1.0.   4.  Reactive leukocytosis.  Likely due to pancreatitis, patient did not receive antibiotics, no signs of infection or pancreatic necrosis.  His discharge white cell count is 12.6.  5.  Transient reactive hypertension.  Blood pressure improved with supportive medical therapy, his discharge blood pressure is 126/ 86.  Discharge Diagnoses:  Principal Problem:   Acute gallstone pancreatitis Active Problems:   Hypertensive urgency   Leukocytosis   Pancreatitis    Discharge Instructions  Discharge Instructions    Diet - low sodium heart healthy   Complete by:  As directed    Discharge instructions   Complete by:  As directed    Please follow with primary care in 7 days, and with surgery as scheduled.   Increase activity slowly   Complete by:  As directed      Allergies as of  07/16/2018  Reactions   Amoxicillin Rash   Penicillins Rash      Medication List    TAKE these medications   albuterol 108 (90 Base) MCG/ACT inhaler Commonly known as:  PROVENTIL HFA;VENTOLIN HFA 2 puffs every 4-6 hours as needed for chest tightness or wheezing. What changed:    how much to take  how to take this  when to take this  reasons to take this  additional instructions   ibuprofen 400 MG tablet Commonly known as:  ADVIL,MOTRIN Take 1 tablet (400 mg total) by mouth every 6 (six) hours as needed for moderate pain.   oxyCODONE 5 MG immediate release tablet Commonly known as:  Oxy IR/ROXICODONE Take 1 tablet (5 mg total) by mouth every 6 (six) hours as needed for severe pain.   pantoprazole 40 MG tablet Commonly known as:  PROTONIX Take 1 tablet (40 mg total) by mouth daily for 15 days.       Allergies  Allergen Reactions  . Amoxicillin Rash  . Penicillins Rash    Consultations:  Surgery    Procedures/Studies: Ct Abdomen Pelvis Wo Contrast  Result Date: 07/11/2018 CLINICAL DATA:  Epigastric pain. EXAM: CT ABDOMEN AND PELVIS WITHOUT CONTRAST TECHNIQUE: Multidetector CT imaging of the abdomen and pelvis was performed following the standard protocol without IV contrast. COMPARISON:  Abdominal ultrasound examination from today. FINDINGS: Lower chest: The lung bases are clear of an acute process. Minimal dependent subpleural atelectasis. The heart is normal in size. No pericardial effusion. Hepatobiliary: No focal hepatic lesions or intrahepatic biliary dilatation without contrast. Gallbladder is mildly distended and there are several gallstones noted. No significant pericholecystic inflammatory change or fluid. No common bile duct dilatation. Pancreas: The pancreas is inflamed/edematous and there is significant surrounding fluid and likely inflammatory phlegmon consistent with acute pancreatitis. Moderate fluid noted in the anterior pararenal spaces  bilaterally. Spleen: Normal size.  No focal lesions. Adrenals/Urinary Tract: The adrenal glands and kidneys are unremarkable. No renal, ureteral or bladder calculi. Stomach/Bowel: The stomach is unremarkable. There is moderate inflammation surrounding the second and third portions of the duodenum. No findings for obstruction. Contrast gets into the distal small bowel. The small bowel and colon are grossly normal. Vascular/Lymphatic: The aorta is normal in caliber. No atheroscerlotic calcifications. No mesenteric of retroperitoneal mass or adenopathy. Small scattered lymph nodes are noted. Reproductive: The prostate gland and seminal vesicles are unremarkable. Other: Moderate free pelvic fluid. No pelvic adenopathy. No inguinal adenopathy or hernia. Musculoskeletal: No significant bony findings. IMPRESSION: 1. Acute pancreatitis with moderate pancreatic and peripancreatic inflammatory changes and free fluid in the anterior pararenal spaces and also in the pelvis. Without contrast no comment can be made about pancreatic necrosis. 2. Cholelithiasis without definite CT findings for acute cholecystitis. 3. Inflammation of the second and third portions of the duodenum due to the pancreatitis. Electronically Signed   By: Rudie Meyer M.D.   On: 07/11/2018 21:34   Dg Chest 2 View  Result Date: 07/11/2018 CLINICAL DATA:  Chest pain EXAM: CHEST - 2 VIEW COMPARISON:  06/15/2014 FINDINGS: The heart size and mediastinal contours are within normal limits. Both lungs are clear. The visualized skeletal structures are unremarkable. IMPRESSION: No active cardiopulmonary disease. Electronically Signed   By: Deatra Robinson M.D.   On: 07/11/2018 17:57   Dg Cholangiogram Operative  Result Date: 07/15/2018 CLINICAL DATA:  Intraoperative cholangiogram during laparoscopic cholecystectomy. EXAM: INTRAOPERATIVE CHOLANGIOGRAM FLUOROSCOPY TIME:  14 seconds COMPARISON:  CT abdomen pelvis - 07/11/2018 FINDINGS: Intraoperative  cholangiographic images  of the right upper abdominal quadrant during laparoscopic cholecystectomy are provided for review. Contrast injection demonstrates extravasation of contrast about the cystic duct cannulation site. There is faint opacification the common bile duct with an apparent persistent ill-defined filling defect within the distal aspect of the CBD, however overall there is suboptimal opacification of the biliary system. IMPRESSION: Degraded intraoperative cholangiogram with persistent ill-defined filling defect within the distal aspect of the CBD as could be seen in the setting of choledocholithiasis. Further evaluation and management with ERCP could be performed as indicated. Electronically Signed   By: Simonne ComeJohn  Watts M.D.   On: 07/15/2018 13:13   [place] Abdomen Limited  Result Date: 07/11/2018 CLINICAL DATA:  Right upper quadrant abdominal pain with nausea and vomiting. EXAM: ULTRASOUND ABDOMEN LIMITED RIGHT UPPER QUADRANT COMPARISON:  None. FINDINGS: Gallbladder: Numerous gallstones are noted the gallbladder. The largest calculus measures 1.4 cm. No gallbladder wall thickening or pericholecystic fluid. Equivocal sonographic Murphy sign. Common bile duct: Diameter: 3.6 mm Liver: Normal echogenicity without focal lesion or biliary dilatation. Portal vein is patent on color Doppler imaging with normal direction of blood flow towards the liver. IMPRESSION: 1. Cholelithiasis without sonographic findings for acute cholecystitis other than an equivocal sonographic Murphy sign. 2. Normal caliber common bile duct and normal liver. Electronically Signed   By: Rudie MeyerP.  Gallerani M.D.   On: 07/11/2018 20:53       Subjective: Patient is feeling well, he is tolerating po well, no nausea or vomiting, abdominal pain is mild, improved with analgesics.   Discharge Exam: Vitals:   07/16/18 0506 07/16/18 1023  BP: (!) 135/54 126/86  Pulse: 95 91  Resp: 18 12  Temp: 98.2 F (36.8 C) 98.2 F (36.8 C)  SpO2: 94%  95%   Vitals:   07/16/18 0112 07/16/18 0459 07/16/18 0506 07/16/18 1023  BP: 122/81 124/72 (!) 135/54 126/86  Pulse: 86 89 95 91  Resp: 18 18 18 12   Temp: 97.6 F (36.4 C) 98.2 F (36.8 C) 98.2 F (36.8 C) 98.2 F (36.8 C)  TempSrc: Oral Oral Oral Oral  SpO2: 94% 93% 94% 95%  Weight:      Height:        General: Not in pain or dyspnea.  Neurology: Awake and alert, non focal  E ENT: no pallor, no icterus, oral mucosa moist Cardiovascular: No JVD. S1-S2 present, rhythmic, no gallops, rubs, or murmurs. No lower extremity edema. Pulmonary: vesicular breath sounds bilaterally, adequate air movement, no wheezing, rhonchi or rales. Gastrointestinal. Abdomen with no organomegaly, non tender, no rebound or guarding Skin. No rashes Musculoskeletal: no joint deformities   The results of significant diagnostics from this hospitalization (including imaging, microbiology, ancillary and laboratory) are listed below for reference.     Microbiology: Recent Results (from the past 240 hour(s))  Surgical pcr screen     Status: None   Collection Time: 07/11/18 11:52 PM  Result Value Ref Range Status   MRSA, PCR NEGATIVE NEGATIVE Final   Staphylococcus aureus NEGATIVE NEGATIVE Final    Comment: (NOTE) The Xpert SA Assay (FDA approved for NASAL specimens in patients 49 years of age and older), is one component of a comprehensive surveillance program. It is not intended to diagnose infection nor to guide or monitor treatment. Performed at [facility], [address] [place], [place] [address]      Labs: BNP (last 3 results) No results for input(s): BNP in the last 8760 hours. Basic Metabolic Panel: Recent Labs  Lab 07/12/18  0210 07/13/18 0343 07/14/18 0445 07/15/18 0319 07/16/18 0858  NA 142 136 133* 136 141  K 4.3 3.7 3.8 3.5 3.4*  CL 105 99 99 99 104  CO2 23 25 24 28 27   GLUCOSE 129* 120* 95 105* 129*  BUN 11 7 7 8 10   CREATININE 1.15 1.07 1.09 1.01 1.06  CALCIUM  8.8* 8.7* 8.0* 7.8* 8.3*   Liver Function Tests: Recent Labs  Lab 07/11/18 2058 07/12/18 0210 07/13/18 0343 07/16/18 0858  AST 235* 85* 34 56*  ALT 251* 165* 73* 69*  ALKPHOS 92 80 60 118  BILITOT 1.9* 0.9 1.1 0.6  PROT 7.5 6.6 6.7 5.8*  ALBUMIN 4.6 4.0 3.3* 2.3*   Recent Labs  Lab 07/11/18 2058 07/12/18 0210 07/13/18 0343  LIPASE 597* 273* 171*   No results for input(s): AMMONIA in the last 168 hours. CBC: Recent Labs  Lab 07/12/18 0210 07/13/18 0343 07/14/18 0445 07/15/18 0319 07/16/18 0858  WBC 19.3* 27.7* 18.0* 13.3* 12.6*  NEUTROABS 17.5* 24.9* 15.8* 11.5*  --   HGB 18.6* 15.8 13.4 12.2* 12.0*  HCT 55.7* 46.7 40.2 36.4* 36.0*  MCV 86.4 87.5 87.0 87.3 87.0  PLT 312 234 183 197 259   Cardiac Enzymes: No results for input(s): CKTOTAL, CKMB, CKMBINDEX, TROPONINI in the last 168 hours. BNP: Invalid input(s): POCBNP CBG: Recent Labs  Lab 07/12/18 0748 07/13/18 0753 07/14/18 0756 07/15/18 0828 07/16/18 0808  GLUCAP 126* 110* 93 95 126*   D-Dimer No results for input(s): DDIMER in the last 72 hours. Hgb A1c No results for input(s): HGBA1C in the last 72 hours. Lipid Profile No results for input(s): CHOL, HDL, LDLCALC, TRIG, CHOLHDL, LDLDIRECT in the last 72 hours. Thyroid function studies No results for input(s): TSH, T4TOTAL, T3FREE, THYROIDAB in the last 72 hours.  Invalid input(s): FREET3 Anemia work up No results for input(s): VITAMINB12, FOLATE, FERRITIN, TIBC, IRON, RETICCTPCT in the last 72 hours. Urinalysis    Component Value Date/Time   COLORURINE YELLOW 12/04/2007 0107   APPEARANCEUR CLEAR 12/04/2007 0107   LABSPEC 1.021 12/04/2007 0107   PHURINE 6.0 12/04/2007 0107   GLUCOSEU NEGATIVE 12/04/2007 0107   HGBUR NEGATIVE 12/04/2007 0107   BILIRUBINUR NEGATIVE 12/04/2007 0107   KETONESUR NEGATIVE 12/04/2007 0107   PROTEINUR NEGATIVE 12/04/2007 0107   UROBILINOGEN 0.2 12/04/2007 0107   NITRITE NEGATIVE 12/04/2007 0107   LEUKOCYTESUR   12/04/2007 0107    NEGATIVE MICROSCOPIC NOT DONE ON URINES WITH NEGATIVE PROTEIN, BLOOD, LEUKOCYTES, NITRITE, OR GLUCOSE <1000 mg/dL.   Sepsis Labs Invalid input(s): PROCALCITONIN,  WBC,  LACTICIDVEN Microbiology Recent Results (from the past 240 hour(s))  Surgical pcr screen     Status: None   Collection Time: 07/11/18 11:52 PM  Result Value Ref Range Status   MRSA, PCR NEGATIVE NEGATIVE Final   Staphylococcus aureus NEGATIVE NEGATIVE Final    Comment: (NOTE) The Xpert SA Assay (FDA approved for NASAL specimens in patients 83 years of age and older), is one component of a comprehensive surveillance program. It is not intended to diagnose infection nor to guide or monitor treatment. Performed at [facility], [address] [place], [place] [address]      Time coordinating discharge: 45 minutes  SIGNED:   Coralie Keens, MD  [facility] 07/16/2018, 10:40 AM

## 2018-07-16 NOTE — Progress Notes (Signed)
Patient D/C to home  With orders and prescriptions

## 2018-08-13 ENCOUNTER — Encounter: Payer: Self-pay | Admitting: Gastroenterology

## 2018-08-13 ENCOUNTER — Ambulatory Visit (INDEPENDENT_AMBULATORY_CARE_PROVIDER_SITE_OTHER): Payer: 59 | Admitting: Gastroenterology

## 2018-08-13 VITALS — BP 96/66 | HR 84 | Ht 61.0 in | Wt 124.0 lb

## 2018-08-13 DIAGNOSIS — K859 Acute pancreatitis without necrosis or infection, unspecified: Secondary | ICD-10-CM | POA: Diagnosis not present

## 2018-08-13 NOTE — Progress Notes (Addendum)
HPI: This is a very pleasant 48 year old man who was referred by Janie Morning at [facility]  Chief complaint is elevated liver tests, abdominal pain, pancreatitis  He was admitted about a month ago for mild gallstone related acute pancreatitis.  While he was in the hospital he underwent uneventful laparoscopic cholecystectomy with Dr. Kieth Brightly.  Intra-Op cholangiogram was not normal.  See the 2 different interpretations of the IOC below  Since then he was recovering very well from his surgery until this past Thursday night when he started to have again severe 9 out of 10 midepigastric abdominal pains.  No fever no chills.  Mild nausea.  He presented to [facility] and had repeat labs done.  His total bilirubin was 1.3, his alk phos was 227, his AST was 409 and his ALT was 427.  Lipase 634, amylase 855.  Clearly he had mild acute pancreatitis again.  He was given tramadol and some Phenergan was told to stay hydrated and over the weekend his pains dramatically improved.  Today he feels really back to normal.   Old Data Reviewed:  Cut and pasted portion of operative report from 07/2018 laparoscopic cholecystectomy, Dr. Kieth Brightly. "The cystic duct and cystic artery were identified and further dissection revealed a critical view, due to concern for choledocholithiasis a cholangiogram was performed with Dwyane Dee Clamp. There was some leaking of the contrast, but visualization of the common bile duct and doudenal filling was identified without filling defect. The cystic duct and cystic artery were doubly clipped and ligated."   Radiologist interpretation of the same 07/2018 Intra-Op cholangiogram: Degraded intraoperative cholangiogram with persistent ill-defined filling defect within the distal aspect of the CBD as could be seen in the setting of choledocholithiasis. Further evaluation and management with ERCP could be performed as indicated.  Most recent lab tests  July 16, 2018, postoperative from his lap chole show AST 56, ALT 69. Blood work July 11, 2018 AST 235, ALT 251, total bilirubin 1.9.   Ultrasound July 11, 2018 1. Cholelithiasis without sonographic findings for acute cholecystitis other than an equivocal sonographic Murphy sign. 2. Normal caliber common bile duct and normal liver.  CT scan abdomen pelvis without IV contrast July 11, 2018 1. Acute pancreatitis with moderate pancreatic and peripancreatic inflammatory changes and free fluid in the anterior pararenal spaces and also in the pelvis. Without contrast no comment can be made about pancreatic necrosis. 2. Cholelithiasis without definite CT findings for acute cholecystitis. 3. Inflammation of the second and third portions of the duodenum due to the pancreatitis.   Review of systems: Pertinent positive and negative review of systems were noted in the above HPI section. All other review negative.   Past Medical History:  Diagnosis Date  . Allergy   . Bronchitis   . Gallstones   . GERD (gastroesophageal reflux disease)   . Pancreatitis   . Pneumonia     Past Surgical History:  Procedure Laterality Date  . CERVICAL DISC SURGERY    . CHOLECYSTECTOMY N/A 07/15/2018   Procedure: LAPAROSCOPIC CHOLECYSTECTOMY WITH INTRAOPERATIVE CHOLANGIOGRAM;  Surgeon: Kinsinger, Arta Bruce, MD;  Location: [place];  Service: General;  Laterality: N/A;  . HIP SURGERY Right    as a child  . SHOULDER SURGERY Right    age 70, bone taken from right hip and put in shoulder    Current Outpatient Medications  Medication Sig Dispense Refill  . albuterol (PROVENTIL HFA;VENTOLIN HFA) 108 (90 BASE) MCG/ACT inhaler 2 puffs every 4-6 hours  as needed for chest tightness or wheezing. (Patient taking differently: Inhale 2 puffs into the lungs every 6 (six) hours as needed for wheezing. ) 1 Inhaler 1  . promethazine (PHENERGAN) 12.5 MG tablet Take 12.5-25 mg by mouth every 6 (six) hours as needed for nausea  or vomiting.    . traMADol (ULTRAM) 50 MG tablet Take 2 tablets by mouth every 6 (six) hours as needed.      No current facility-administered medications for this visit.     Allergies as of 08/13/2018 - Review Complete 08/13/2018  Allergen Reaction Noted  . Amoxicillin Rash 05/20/2011  . Penicillins Rash 08/22/2012    Family History  Problem Relation Age of Onset  . Diverticulitis Mother   . Alzheimer's disease Mother   . Alcoholism Father   . Congestive Heart Failure Maternal Grandmother   . Alzheimer's disease Maternal Grandfather   . Heart attack Paternal Grandmother   . Alzheimer's disease Paternal Grandmother   . Hypertension Paternal Grandmother     Social History   Socioeconomic History  . Marital status: Single    Spouse name: Not on file  . Number of children: 0  . Years of education: Not on file  . Highest education level: Not on file  Occupational History  . Occupation: city of [employer]  Social Needs  . Financial resource strain: Not on file  . Food insecurity:    Worry: Not on file    Inability: Not on file  . Transportation needs:    Medical: Not on file    Non-medical: Not on file  Tobacco Use  . Smoking status: Never Smoker  . Smokeless tobacco: Never Used  Substance and Sexual Activity  . Alcohol use: Not Currently    Comment: once a week  . Drug use: No  . Sexual activity: Not on file  Lifestyle  . Physical activity:    Days per week: Not on file    Minutes per session: Not on file  . Stress: Not on file  Relationships  . Social connections:    Talks on phone: Not on file    Gets together: Not on file    Attends religious service: Not on file    Active member of club or organization: Not on file    Attends meetings of clubs or organizations: Not on file    Relationship status: Not on file  . Intimate partner violence:    Fear of current or ex partner: Not on file    Emotionally abused: Not on file    Physically abused: Not on file     Forced sexual activity: Not on file  Other Topics Concern  . Not on file  Social History Narrative  . Not on file     Physical Exam: BP 96/66 (BP Location: Left Arm, Patient Position: Sitting, Cuff Size: Normal)   Pulse 84   Ht '5\' 1"'  (1.549 m) Comment: height measured without shoes  Wt 124 lb (56.2 kg)   BMI 23.43 kg/m  Constitutional: generally well-appearing Psychiatric: alert and oriented x3 Eyes: extraocular movements intact Mouth: oral pharynx moist, no lesions Neck: supple no lymphadenopathy Cardiovascular: heart regular rate and rhythm Lungs: clear to auscultation bilaterally Abdomen: soft, nontender, nondistended, no obvious ascites, no peritoneal signs, normal bowel sounds Extremities: no lower extremity edema bilaterally Skin: no lesions on visible extremities   Assessment and plan: 48 y.o. male with recurrent acute pancreatitis, status post laparoscopic cholecystectomy 5 or 6 weeks ago with possible abnormal IOC  He looks very well in the office today.   He has no abdominal pains.  He is eating normally.  Clinically his recurrent mild acute pancreatitis has resolved.  He tells me he had repeat labs done just yesterday at [facility] and we do not have those here at the time of the visit I am sending away for them.  Certainly he may have passed another gallstone causing mild gallstone related acute pancreatitis.  It is very difficult to know if he still has any retained gallstones in his common bile duct.  I am arranging for an MRI with MRCP to try to answer that question.  He knows if it shows retained gallstones in his common bile duct and he will need an ERCP.  I also instructed him that if he has severe abdominal pains again he should present to the emergency room for expedited care.   Please see the "Patient Instructions" section for addition details about the plan.   Owens Loffler, MD [facility] 08/13/2018, 11:15 AM  Cc:  [facility] [place] *   Addendum: I reviewed his labs from yesterday done at his primary care office, his amylase was 93 alk phos 174, ALT 126, AST 32, total bilirubin 0.3 lipase 21.  Obviously much improved from 4 or 5 days ago.  Overall seems consistent with a passed bile duct stone.  It will be very interesting to see what the MRCP shows.

## 2018-08-13 NOTE — Patient Instructions (Addendum)
MRI with MRCP; check for retained CBD stones following lap chole 5-6 weeks ago. You may need an ERCP depending on the results of the MRI.  If you have recurrent severe pains, you need to go to the ER.   You have been scheduled for an MRCP at 10:00am on 08/21/2018. Your appointment time is 10:00am. Please arrive 30 minutes prior to your appointment time for registration purposes. Please make certain not to have anything to eat or drink 4 hours prior to your test. In addition, if you have any metal in your body, have a pacemaker or defibrillator, please be sure to let your ordering physician know. This test typically takes 45 minutes to 1 hour to complete. Should you need to reschedule, please call [phone] to do so.

## 2018-08-13 NOTE — H&P (View-Only) (Signed)
HPI: This is a very pleasant 48 year old man who was referred by Janie Morning at [facility]  Chief complaint is elevated liver tests, abdominal pain, pancreatitis  He was admitted about a month ago for mild gallstone related acute pancreatitis.  While he was in the hospital he underwent uneventful laparoscopic cholecystectomy with Dr. Kieth Brightly.  Intra-Op cholangiogram was not normal.  See the 2 different interpretations of the IOC below  Since then he was recovering very well from his surgery until this past Thursday night when he started to have again severe 9 out of 10 midepigastric abdominal pains.  No fever no chills.  Mild nausea.  He presented to [facility] and had repeat labs done.  His total bilirubin was 1.3, his alk phos was 227, his AST was 409 and his ALT was 427.  Lipase 634, amylase 855.  Clearly he had mild acute pancreatitis again.  He was given tramadol and some Phenergan was told to stay hydrated and over the weekend his pains dramatically improved.  Today he feels really back to normal.   Old Data Reviewed:  Cut and pasted portion of operative report from 07/2018 laparoscopic cholecystectomy, Dr. Kieth Brightly. "The cystic duct and cystic artery were identified and further dissection revealed a critical view, due to concern for choledocholithiasis a cholangiogram was performed with Dwyane Dee Clamp. There was some leaking of the contrast, but visualization of the common bile duct and doudenal filling was identified without filling defect. The cystic duct and cystic artery were doubly clipped and ligated."   Radiologist interpretation of the same 07/2018 Intra-Op cholangiogram: Degraded intraoperative cholangiogram with persistent ill-defined filling defect within the distal aspect of the CBD as could be seen in the setting of choledocholithiasis. Further evaluation and management with ERCP could be performed as indicated.  Most recent lab tests  July 16, 2018, postoperative from his lap chole show AST 56, ALT 69. Blood work July 11, 2018 AST 235, ALT 251, total bilirubin 1.9.   Ultrasound July 11, 2018 1. Cholelithiasis without sonographic findings for acute cholecystitis other than an equivocal sonographic Murphy sign. 2. Normal caliber common bile duct and normal liver.  CT scan abdomen pelvis without IV contrast July 11, 2018 1. Acute pancreatitis with moderate pancreatic and peripancreatic inflammatory changes and free fluid in the anterior pararenal spaces and also in the pelvis. Without contrast no comment can be made about pancreatic necrosis. 2. Cholelithiasis without definite CT findings for acute cholecystitis. 3. Inflammation of the second and third portions of the duodenum due to the pancreatitis.   Review of systems: Pertinent positive and negative review of systems were noted in the above HPI section. All other review negative.   Past Medical History:  Diagnosis Date  . Allergy   . Bronchitis   . Gallstones   . GERD (gastroesophageal reflux disease)   . Pancreatitis   . Pneumonia     Past Surgical History:  Procedure Laterality Date  . CERVICAL DISC SURGERY    . CHOLECYSTECTOMY N/A 07/15/2018   Procedure: LAPAROSCOPIC CHOLECYSTECTOMY WITH INTRAOPERATIVE CHOLANGIOGRAM;  Surgeon: Kinsinger, Arta Bruce, MD;  Location: [place];  Service: General;  Laterality: N/A;  . HIP SURGERY Right    as a child  . SHOULDER SURGERY Right    age 65, bone taken from right hip and put in shoulder    Current Outpatient Medications  Medication Sig Dispense Refill  . albuterol (PROVENTIL HFA;VENTOLIN HFA) 108 (90 BASE) MCG/ACT inhaler 2 puffs every 4-6 hours  as needed for chest tightness or wheezing. (Patient taking differently: Inhale 2 puffs into the lungs every 6 (six) hours as needed for wheezing. ) 1 Inhaler 1  . promethazine (PHENERGAN) 12.5 MG tablet Take 12.5-25 mg by mouth every 6 (six) hours as needed for nausea  or vomiting.    . traMADol (ULTRAM) 50 MG tablet Take 2 tablets by mouth every 6 (six) hours as needed.      No current facility-administered medications for this visit.     Allergies as of 08/13/2018 - Review Complete 08/13/2018  Allergen Reaction Noted  . Amoxicillin Rash 05/20/2011  . Penicillins Rash 08/22/2012    Family History  Problem Relation Age of Onset  . Diverticulitis Mother   . Alzheimer's disease Mother   . Alcoholism Father   . Congestive Heart Failure Maternal Grandmother   . Alzheimer's disease Maternal Grandfather   . Heart attack Paternal Grandmother   . Alzheimer's disease Paternal Grandmother   . Hypertension Paternal Grandmother     Social History   Socioeconomic History  . Marital status: Single    Spouse name: Not on file  . Number of children: 0  . Years of education: Not on file  . Highest education level: Not on file  Occupational History  . Occupation: city of [employer]  Social Needs  . Financial resource strain: Not on file  . Food insecurity:    Worry: Not on file    Inability: Not on file  . Transportation needs:    Medical: Not on file    Non-medical: Not on file  Tobacco Use  . Smoking status: Never Smoker  . Smokeless tobacco: Never Used  Substance and Sexual Activity  . Alcohol use: Not Currently    Comment: once a week  . Drug use: No  . Sexual activity: Not on file  Lifestyle  . Physical activity:    Days per week: Not on file    Minutes per session: Not on file  . Stress: Not on file  Relationships  . Social connections:    Talks on phone: Not on file    Gets together: Not on file    Attends religious service: Not on file    Active member of club or organization: Not on file    Attends meetings of clubs or organizations: Not on file    Relationship status: Not on file  . Intimate partner violence:    Fear of current or ex partner: Not on file    Emotionally abused: Not on file    Physically abused: Not on file     Forced sexual activity: Not on file  Other Topics Concern  . Not on file  Social History Narrative  . Not on file     Physical Exam: BP 96/66 (BP Location: Left Arm, Patient Position: Sitting, Cuff Size: Normal)   Pulse 84   Ht '5\' 1"'  (1.549 m) Comment: height measured without shoes  Wt 124 lb (56.2 kg)   BMI 23.43 kg/m  Constitutional: generally well-appearing Psychiatric: alert and oriented x3 Eyes: extraocular movements intact Mouth: oral pharynx moist, no lesions Neck: supple no lymphadenopathy Cardiovascular: heart regular rate and rhythm Lungs: clear to auscultation bilaterally Abdomen: soft, nontender, nondistended, no obvious ascites, no peritoneal signs, normal bowel sounds Extremities: no lower extremity edema bilaterally Skin: no lesions on visible extremities   Assessment and plan: 48 y.o. male with recurrent acute pancreatitis, status post laparoscopic cholecystectomy 5 or 6 weeks ago with possible abnormal IOC  He looks very well in the office today.   He has no abdominal pains.  He is eating normally.  Clinically his recurrent mild acute pancreatitis has resolved.  He tells me he had repeat labs done just yesterday at [facility] and we do not have those here at the time of the visit I am sending away for them.  Certainly he may have passed another gallstone causing mild gallstone related acute pancreatitis.  It is very difficult to know if he still has any retained gallstones in his common bile duct.  I am arranging for an MRI with MRCP to try to answer that question.  He knows if it shows retained gallstones in his common bile duct and he will need an ERCP.  I also instructed him that if he has severe abdominal pains again he should present to the emergency room for expedited care.   Please see the "Patient Instructions" section for addition details about the plan.   Owens Loffler, MD [facility] 08/13/2018, 11:15 AM  Cc:  [facility] [place] *   Addendum: I reviewed his labs from yesterday done at his primary care office, his amylase was 93 alk phos 174, ALT 126, AST 32, total bilirubin 0.3 lipase 21.  Obviously much improved from 4 or 5 days ago.  Overall seems consistent with a passed bile duct stone.  It will be very interesting to see what the MRCP shows.

## 2018-08-21 ENCOUNTER — Other Ambulatory Visit: Payer: Self-pay | Admitting: Gastroenterology

## 2018-08-21 ENCOUNTER — Other Ambulatory Visit: Payer: Self-pay

## 2018-08-21 ENCOUNTER — Ambulatory Visit (HOSPITAL_COMMUNITY)
Admission: RE | Admit: 2018-08-21 | Discharge: 2018-08-21 | Disposition: A | Discharge status: Home / Self Care | Payer: 59 | Source: Ambulatory Visit | Attending: Gastroenterology | Admitting: Gastroenterology

## 2018-08-21 DIAGNOSIS — K859 Acute pancreatitis without necrosis or infection, unspecified: Secondary | ICD-10-CM | POA: Diagnosis not present

## 2018-08-21 MED ORDER — GADOBUTROL 1 MMOL/ML IV SOLN
5.0000 mL | Freq: Once | INTRAVENOUS | Status: AC | PRN
  Administered 2018-08-21: 5 mL via INTRAVENOUS

## 2018-08-22 ENCOUNTER — Other Ambulatory Visit: Payer: Self-pay

## 2018-08-22 DIAGNOSIS — K851 Biliary acute pancreatitis without necrosis or infection: Secondary | ICD-10-CM

## 2018-08-22 DIAGNOSIS — K8041 Calculus of bile duct with cholecystitis, unspecified, with obstruction: Secondary | ICD-10-CM

## 2018-08-27 ENCOUNTER — Encounter (HOSPITAL_COMMUNITY): Payer: Self-pay | Admitting: *Deleted

## 2018-08-29 ENCOUNTER — Encounter (HOSPITAL_COMMUNITY)
Admission: RE | Disposition: A | Discharge status: Home / Self Care | Payer: Self-pay | Source: Home / Self Care | Attending: Gastroenterology

## 2018-08-29 ENCOUNTER — Ambulatory Visit (HOSPITAL_COMMUNITY)
Admission: RE | Admit: 2018-08-29 | Discharge: 2018-08-29 | Disposition: A | Discharge status: Home / Self Care | Payer: 59 | Attending: Gastroenterology | Admitting: Gastroenterology

## 2018-08-29 ENCOUNTER — Ambulatory Visit (HOSPITAL_COMMUNITY): Payer: 59 | Admitting: Anesthesiology

## 2018-08-29 ENCOUNTER — Ambulatory Visit (HOSPITAL_COMMUNITY): Payer: 59

## 2018-08-29 ENCOUNTER — Encounter (HOSPITAL_COMMUNITY): Payer: Self-pay | Admitting: *Deleted

## 2018-08-29 ENCOUNTER — Other Ambulatory Visit: Payer: Self-pay

## 2018-08-29 DIAGNOSIS — K851 Biliary acute pancreatitis without necrosis or infection: Secondary | ICD-10-CM

## 2018-08-29 DIAGNOSIS — K859 Acute pancreatitis without necrosis or infection, unspecified: Secondary | ICD-10-CM | POA: Insufficient documentation

## 2018-08-29 DIAGNOSIS — K8041 Calculus of bile duct with cholecystitis, unspecified, with obstruction: Secondary | ICD-10-CM

## 2018-08-29 DIAGNOSIS — K8050 Calculus of bile duct without cholangitis or cholecystitis without obstruction: Secondary | ICD-10-CM | POA: Diagnosis present

## 2018-08-29 DIAGNOSIS — K2980 Duodenitis without bleeding: Secondary | ICD-10-CM | POA: Diagnosis not present

## 2018-08-29 DIAGNOSIS — K219 Gastro-esophageal reflux disease without esophagitis: Secondary | ICD-10-CM | POA: Insufficient documentation

## 2018-08-29 DIAGNOSIS — K8020 Calculus of gallbladder without cholecystitis without obstruction: Secondary | ICD-10-CM

## 2018-08-29 HISTORY — PX: BIOPSY: SHX5522

## 2018-08-29 HISTORY — PX: SPHINCTEROTOMY: SHX5544

## 2018-08-29 HISTORY — PX: ENDOSCOPIC RETROGRADE CHOLANGIOPANCREATOGRAPHY (ERCP) WITH PROPOFOL: SHX5810

## 2018-08-29 HISTORY — PX: REMOVAL OF STONES: SHX5545

## 2018-08-29 SURGERY — ENDOSCOPIC RETROGRADE CHOLANGIOPANCREATOGRAPHY (ERCP) WITH PROPOFOL
Anesthesia: General

## 2018-08-29 MED ORDER — ROCURONIUM BROMIDE 10 MG/ML (PF) SYRINGE
PREFILLED_SYRINGE | INTRAVENOUS | Status: DC | PRN
  Administered 2018-08-29: 30 mg via INTRAVENOUS

## 2018-08-29 MED ORDER — SUGAMMADEX SODIUM 500 MG/5ML IV SOLN
INTRAVENOUS | Status: DC | PRN
  Administered 2018-08-29: 250 mg via INTRAVENOUS

## 2018-08-29 MED ORDER — PROMETHAZINE HCL 25 MG/ML IJ SOLN: 6.2500 mg | INTRAMUSCULAR | Status: DC | PRN

## 2018-08-29 MED ORDER — PROPOFOL 10 MG/ML IV BOLUS
INTRAVENOUS | Status: AC
  Filled 2018-08-29: qty 20

## 2018-08-29 MED ORDER — LIDOCAINE 2% (20 MG/ML) 5 ML SYRINGE
INTRAMUSCULAR | Status: DC | PRN
  Administered 2018-08-29: 40 mg via INTRAVENOUS
  Administered 2018-08-29: 60 mg via INTRAVENOUS

## 2018-08-29 MED ORDER — FENTANYL CITRATE (PF) 100 MCG/2ML IJ SOLN
INTRAMUSCULAR | Status: AC
  Filled 2018-08-29: qty 2

## 2018-08-29 MED ORDER — SODIUM CHLORIDE 0.9 % IV SOLN: INTRAVENOUS | Status: DC

## 2018-08-29 MED ORDER — CIPROFLOXACIN IN D5W 400 MG/200ML IV SOLN
INTRAVENOUS | Status: DC | PRN
  Administered 2018-08-29: 400 mg via INTRAVENOUS

## 2018-08-29 MED ORDER — MIDAZOLAM HCL 2 MG/2ML IJ SOLN
INTRAMUSCULAR | Status: AC
  Filled 2018-08-29: qty 2

## 2018-08-29 MED ORDER — LACTATED RINGERS IV SOLN
INTRAVENOUS | Status: DC
  Administered 2018-08-29 (×2): via INTRAVENOUS

## 2018-08-29 MED ORDER — DEXAMETHASONE SODIUM PHOSPHATE 10 MG/ML IJ SOLN
INTRAMUSCULAR | Status: DC | PRN
  Administered 2018-08-29: 10 mg via INTRAVENOUS

## 2018-08-29 MED ORDER — INDOMETHACIN 50 MG RE SUPP
RECTAL | Status: AC
  Filled 2018-08-29: qty 2

## 2018-08-29 MED ORDER — SUCCINYLCHOLINE CHLORIDE 200 MG/10ML IV SOSY
PREFILLED_SYRINGE | INTRAVENOUS | Status: DC | PRN
  Administered 2018-08-29: 80 mg via INTRAVENOUS

## 2018-08-29 MED ORDER — PHENYLEPHRINE 40 MCG/ML (10ML) SYRINGE FOR IV PUSH (FOR BLOOD PRESSURE SUPPORT)
PREFILLED_SYRINGE | INTRAVENOUS | Status: DC | PRN
  Administered 2018-08-29 (×3): 80 ug via INTRAVENOUS
  Administered 2018-08-29: 120 ug via INTRAVENOUS

## 2018-08-29 MED ORDER — FENTANYL CITRATE (PF) 100 MCG/2ML IJ SOLN: 25.0000 ug | INTRAMUSCULAR | Status: DC | PRN

## 2018-08-29 MED ORDER — GLUCAGON HCL RDNA (DIAGNOSTIC) 1 MG IJ SOLR
INTRAMUSCULAR | Status: AC
  Filled 2018-08-29: qty 1

## 2018-08-29 MED ORDER — PROPOFOL 10 MG/ML IV BOLUS
INTRAVENOUS | Status: DC | PRN
  Administered 2018-08-29: 150 mg via INTRAVENOUS

## 2018-08-29 MED ORDER — MIDAZOLAM HCL 5 MG/5ML IJ SOLN
INTRAMUSCULAR | Status: DC | PRN
  Administered 2018-08-29: 2 mg via INTRAVENOUS

## 2018-08-29 MED ORDER — FENTANYL CITRATE (PF) 100 MCG/2ML IJ SOLN
INTRAMUSCULAR | Status: DC | PRN
  Administered 2018-08-29: 50 ug via INTRAVENOUS
  Administered 2018-08-29: 25 ug via INTRAVENOUS

## 2018-08-29 MED ORDER — SODIUM CHLORIDE 0.9 % IV SOLN
INTRAVENOUS | Status: DC | PRN
  Administered 2018-08-29: 30 mL

## 2018-08-29 MED ORDER — INDOMETHACIN 50 MG RE SUPP
RECTAL | Status: DC | PRN
  Administered 2018-08-29: 100 mg via RECTAL

## 2018-08-29 MED ORDER — ONDANSETRON HCL 4 MG/2ML IJ SOLN
INTRAMUSCULAR | Status: DC | PRN
  Administered 2018-08-29: 4 mg via INTRAVENOUS

## 2018-08-29 NOTE — Anesthesia Procedure Notes (Signed)
Date/Time: 08/29/2018 1:02 PM Performed by: Minerva Ends, CRNA Oxygen Delivery Method: Simple face mask Placement Confirmation: positive ETCO2 and breath sounds checked- equal and bilateral Dental Injury: Teeth and Oropharynx as per pre-operative assessment

## 2018-08-29 NOTE — Op Note (Signed)
[facility] Patient Name: Harry Morales Procedure Date: 08/29/2018 MRN: [id number] Attending MD: Rachael Fee , MD Date of Birth: [birth year] CSN: [id number] Age: 48 Admit Type: Inpatient Procedure:                ERCP Indications:              Common bile duct stone(s) Providers:                Rachael Fee, MD, Margaree Mackintosh, RN,                            Arlee Muslim Tech., Technician, Heron Nay,                            CRNA Referring MD:              Medicines:                General Anesthesia, Cipro 400 mg IV, Indomethacin                            100 mg PR Complications:            No immediate complications. Estimated blood loss:                            None Estimated Blood Loss:     Estimated blood loss: none. Procedure:                Pre-Anesthesia Assessment:                           - Prior to the procedure, a History and Physical                            was performed, and patient medications and                            allergies were reviewed. The patient's tolerance of                            previous anesthesia was also reviewed. The risks                            and benefits of the procedure and the sedation                            options and risks were discussed with the patient.                            All questions were answered, and informed consent                            was obtained. Prior Anticoagulants: The patient has                            taken no previous anticoagulant or antiplatelet  agents. ASA Grade Assessment: II - A patient with                            mild systemic disease. After reviewing the risks                            and benefits, the patient was deemed in                            satisfactory condition to undergo the procedure.                           After obtaining informed consent, the scope was                            passed  under direct vision. Throughout the                            procedure, the patient's blood pressure, pulse, and                            oxygen saturations were monitored continuously. The                            [id number]-Q180V ([id number]) Olympus Duodensocope was                            introduced through the mouth, and used to inject                            contrast into and used to inject contrast into the                            bile duct. The ERCP was accomplished without                            difficulty. The patient tolerated the procedure                            well. Scope In: Scope Out: Findings:      A scout film of the abdomen was obtained. Surgical clips, consistent       with a previous cholecystectomy, were seen in the area of the right       upper quadrant of the abdomen. The esophagus was successfully intubated       under direct vision. The scope was advanced to the major papilla in the       descending duodenum without detailed examination of the pharynx, larynx       and associated structures, and upper GI tract. The periampullary mucosa       was abnormal appearing (1cm across, edematous, not frankly neoplastic).       The major papilla was at the most distal aspect of the abnormal mucosa       (see images). The abnormal mucosa was sampled with biopsy forceps. A 44  Autotome over a .035 hydrawire was used to cannulate the bile duct and       contast was injected. The extrahepatic biliary tree was non-dilated and       it contained 2-3 small mobile filling defects. An adequate biliary       sphincterotomy was performed and then the bile dect was swept several       times with a 9-10812mm biliary retrieval balloon. Three small, white,       irregularly shaped stones were delivered into the duodenum. Occlusion       cholangiogram showed no remaining filling defects. The man pancreatic       duct was cannulated with the wire but never injected with  contrast. Impression:               - Choledocholithiasis, treated today with biliary                            sphincerotomy and balloon sweeping.                           - Irregular periampullary mucosa which I suspect is                            inflammatory, related to recent passage of stones.                            Mucosal biopseis were taken. Moderate Sedation:      Not Applicable - Patient had care per Anesthesia. Recommendation:           - Discharge patient to home (ambulatory).                           - Await pathology.                           - Follow clinically. Procedure Code(s):        --- Professional ---                           [id number], Endoscopic retrograde                            cholangiopancreatography (ERCP); with                            sphincterotomy/papillotomy Diagnosis Code(s):        --- Professional ---                           K80.50, Calculus of bile duct without cholangitis                            or cholecystitis without obstruction CPT copyright 2018 American Medical Association. All rights reserved. The codes documented in this report are preliminary and upon coder review may  be revised to meet current compliance requirements. Rachael Feeaniel P , MD 08/29/2018 12:57:37 PM This report has been signed electronically. Number of Addenda: 0

## 2018-08-29 NOTE — Anesthesia Postprocedure Evaluation (Signed)
Anesthesia Post Note  Patient: Harry Morales  Procedure(s) Performed: ENDOSCOPIC RETROGRADE CHOLANGIOPANCREATOGRAPHY (ERCP) WITH PROPOFOL (N/A ) SPHINCTEROTOMY REMOVAL OF STONES BIOPSY     Patient location during evaluation: PACU Anesthesia Type: General Level of consciousness: awake and alert, awake and oriented Pain management: pain level controlled Vital Signs Assessment: post-procedure vital signs reviewed and stable Respiratory status: spontaneous breathing, nonlabored ventilation and respiratory function stable Cardiovascular status: blood pressure returned to baseline and stable Postop Assessment: no apparent nausea or vomiting Anesthetic complications: no    Last Vitals:  Vitals:   08/29/18 1310 08/29/18 1320  BP: 136/76 128/83  Pulse: 94 88  Resp: 16 15  Temp: (!) 36.4 C   SpO2: 100% 100%    Last Pain:  Vitals:   08/29/18 1320  TempSrc:   PainSc: 0-No pain                 Cecile Hearing

## 2018-08-29 NOTE — Anesthesia Procedure Notes (Signed)
Procedure Name: Intubation Date/Time: 08/29/2018 11:59 AM Performed by: Cynda Familia, CRNA Pre-anesthesia Checklist: Patient identified, Emergency Drugs available, Suction available and Patient being monitored Patient Re-evaluated:Patient Re-evaluated prior to induction Oxygen Delivery Method: Circle System Utilized Preoxygenation: Pre-oxygenation with 100% oxygen Induction Type: IV induction Ventilation: Mask ventilation without difficulty Laryngoscope Size: Mac and 3 Grade View: Grade I Tube type: Oral Tube size: 7.0 mm Number of attempts: 1 Airway Equipment and Method: Stylet Placement Confirmation: ETT inserted through vocal cords under direct vision,  positive ETCO2 and breath sounds checked- equal and bilateral Secured at: 21 cm Tube secured with: Tape Dental Injury: Teeth and Oropharynx as per pre-operative assessment  Comments: Smooth IV induction Turk-- intubation LA CRNA- reports atraumatic-- reports bilat BS Gifford Shave

## 2018-08-29 NOTE — Anesthesia Preprocedure Evaluation (Signed)
Anesthesia Evaluation  Patient identified by MRN, date of birth, ID band Patient awake    Reviewed: Allergy & Precautions, NPO status , Patient's Chart, lab work & pertinent test results  Airway Mallampati: II  TM Distance: >3 FB Neck ROM: Full    Dental  (+) Dental Advisory Given, Upper Dentures, Missing   Pulmonary neg pulmonary ROS,    Pulmonary exam normal breath sounds clear to auscultation       Cardiovascular negative cardio ROS Normal cardiovascular exam Rhythm:Regular Rate:Normal     Neuro/Psych  Headaches,    GI/Hepatic Neg liver ROS, GERD  ,Bile duct stone   Endo/Other  negative endocrine ROS  Renal/GU negative Renal ROS     Musculoskeletal negative musculoskeletal ROS (+)   Abdominal   Peds  Hematology negative hematology ROS (+)   Anesthesia Other Findings Day of surgery medications reviewed with the patient.  Reproductive/Obstetrics                             Anesthesia Physical Anesthesia Plan  ASA: II  Anesthesia Plan: General   Post-op Pain Management:    Induction: Intravenous  PONV Risk Score and Plan: 2 and Ondansetron, Dexamethasone and Midazolam  Airway Management Planned: Oral ETT  Additional Equipment:   Intra-op Plan:   Post-operative Plan: Extubation in OR  Informed Consent: I have reviewed the patients History and Physical, chart, labs and discussed the procedure including the risks, benefits and alternatives for the proposed anesthesia with the patient or authorized representative who has indicated his/her understanding and acceptance.     Dental advisory given  Plan Discussed with: CRNA  Anesthesia Plan Comments:         Anesthesia Quick Evaluation

## 2018-08-29 NOTE — Interval H&P Note (Signed)
History and Physical Interval Note:  08/29/2018 10:41 AM  Harry Morales  has presented today for surgery, with the diagnosis of bile duct stone.  The various methods of treatment have been discussed with the patient and family. After consideration of risks, benefits and other options for treatment, the patient has consented to  Procedure(s): ENDOSCOPIC RETROGRADE CHOLANGIOPANCREATOGRAPHY (ERCP) WITH PROPOFOL (N/A) as a surgical intervention.  The patient's history has been reviewed, patient examined, no change in status, stable for surgery.  I have reviewed the patient's chart and labs.  Questions were answered to the patient's satisfaction.     Rachael Fee

## 2018-08-29 NOTE — Discharge Instructions (Signed)
YOU HAD AN ENDOSCOPIC PROCEDURE TODAY: Refer to the procedure report and other information in the discharge instructions given to you for any specific questions about what was found during the examination. If this information does not answer your questions, please call [place] office at [phone] to clarify.  ° °YOU SHOULD EXPECT: Some feelings of bloating in the abdomen. Passage of more gas than usual. Walking can help get rid of the air that was put into your GI tract during the procedure and reduce the bloating. If you had a lower endoscopy (such as a colonoscopy or flexible sigmoidoscopy) you may notice spotting of blood in your stool or on the toilet paper. Some abdominal soreness may be present for a day or two, also. ° °DIET: Your first meal following the procedure should be a light meal and then it is ok to progress to your normal diet. A half-sandwich or bowl of soup is an example of a good first meal. Heavy or fried foods are harder to digest and may make you feel nauseous or bloated. Drink plenty of fluids but you should avoid alcoholic beverages for 24 hours. If you had a esophageal dilation, please see attached instructions for diet.   ° °ACTIVITY: Your care partner should take you home directly after the procedure. You should plan to take it easy, moving slowly for the rest of the day. You can resume normal activity the day after the procedure however YOU SHOULD NOT DRIVE, use power tools, machinery or perform tasks that involve climbing or major physical exertion for 24 hours (because of the sedation medicines used during the test).  ° °SYMPTOMS TO REPORT IMMEDIATELY: °A gastroenterologist can be reached at any hour. Please call [phone]  for any of the following symptoms:  °Following lower endoscopy (colonoscopy, flexible sigmoidoscopy) °Excessive amounts of blood in the stool  °Significant tenderness, worsening of abdominal pains  °Swelling of the abdomen that is new, acute  °Fever of 100° or  higher  °Following upper endoscopy (EGD, EUS, ERCP, esophageal dilation) °Vomiting of blood or coffee ground material  °New, significant abdominal pain  °New, significant chest pain or pain under the shoulder blades  °Painful or persistently difficult swallowing  °New shortness of breath  °Black, tarry-looking or red, bloody stools ° °FOLLOW UP:  °If any biopsies were taken you will be contacted by phone or by letter within the next 1-3 weeks. Call [phone]  if you have not heard about the biopsies in 3 weeks.  °Please also call with any specific questions about appointments or follow up tests. ° °

## 2018-08-29 NOTE — Transfer of Care (Signed)
Immediate Anesthesia Transfer of Care Note  Patient: Harry Morales  Procedure(s) Performed: ENDOSCOPIC RETROGRADE CHOLANGIOPANCREATOGRAPHY (ERCP) WITH PROPOFOL (N/A ) SPHINCTEROTOMY REMOVAL OF STONES BIOPSY  Patient Location: PACU and Endoscopy Unit  Anesthesia Type:General  Level of Consciousness: awake and alert   Airway & Oxygen Therapy: Patient Spontanous Breathing and Patient connected to face mask oxygen  Post-op Assessment: Report given to RN and Post -op Vital signs reviewed and stable  Post vital signs: Reviewed and stable  Last Vitals:  Vitals Value Taken Time  BP    Temp    Pulse 90 08/29/2018  1:34 PM  Resp 23 08/29/2018  1:34 PM  SpO2 96 % 08/29/2018  1:34 PM  Vitals shown include unvalidated device data.  Last Pain:  Vitals:   08/29/18 1320  TempSrc:   PainSc: 0-No pain         Complications: No apparent anesthesia complications

## 2018-08-30 ENCOUNTER — Encounter (HOSPITAL_COMMUNITY): Payer: Self-pay | Admitting: Gastroenterology

## 2019-06-27 IMAGING — MR MR ABDOMEN WO/W CM MRCP
11 of 23 series · 21 of 48 positions shown · IV contrast (gadavist)
Comparison: Noncontrast CT on 07/11/2018

CLINICAL DATA: Acute pancreatitis. Recurrent abdominal pain.
Approximately 1 month status post cholecystectomy.

EXAM:
MRI ABDOMEN WITHOUT AND WITH CONTRAST (INCLUDING MRCP)
TECHNIQUE: Multiplanar multisequence MR imaging of the abdomen was performed
both before and after the administration of intravenous contrast.
Heavily T2-weighted images of the biliary and pancreatic ducts were
obtained, and three-dimensional MRCP images were rendered by post
processing.
CONTRAST:  5 mL Gadavist

[Series 3: T2 fat-sat · axial · 5.0mm · 0.86mm/px · z∈[-91,+164]mm · 2 of 52 slices shown]
[im 1/52]
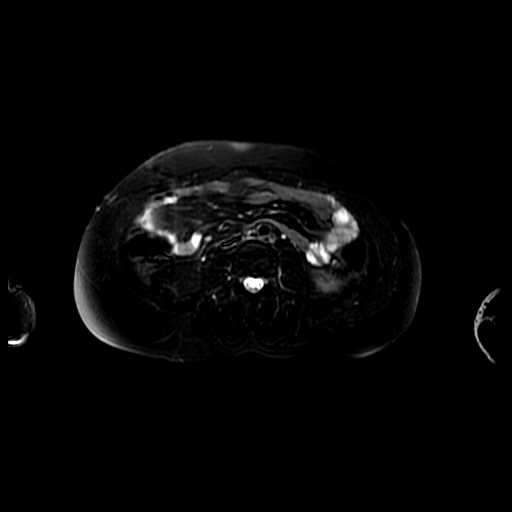
[im 52/52]
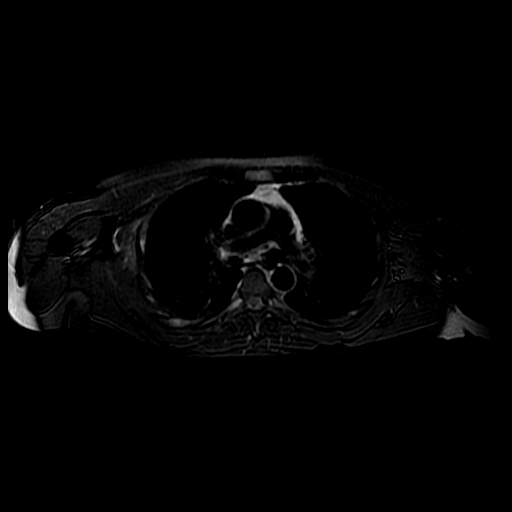

[Series 5: MRCP · coronal · 2.0mm · 0.70mm/px · 3 of 58 slices shown (1 of 2)]
[im 1/58]
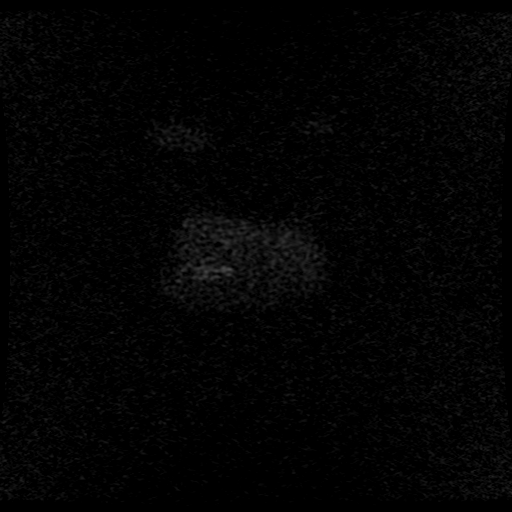
[im 29/58]
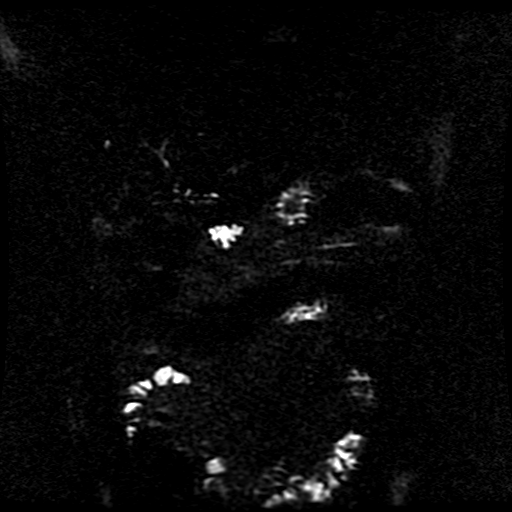
[im 58/58]
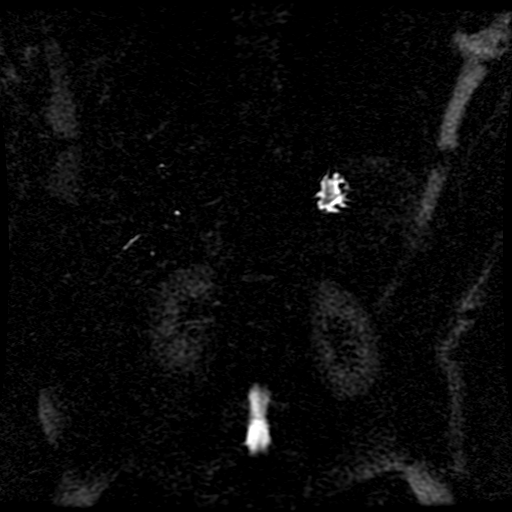

[Series 6: DWI b500 · axial · 6.0mm · 1.76mm/px · z∈[-124,+149]mm · 3 of 72 slices shown]
[im 1/72]
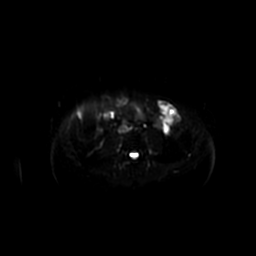
[im 36/72]
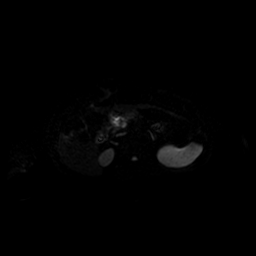
[im 72/72]
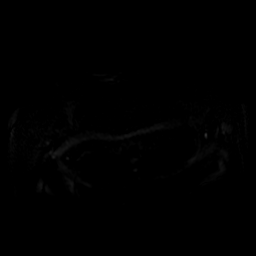

[Series 9: bSSFP fat-sat · coronal · 5.0mm · 0.64mm/px · 2 of 39 slices shown]
[im 1/39]
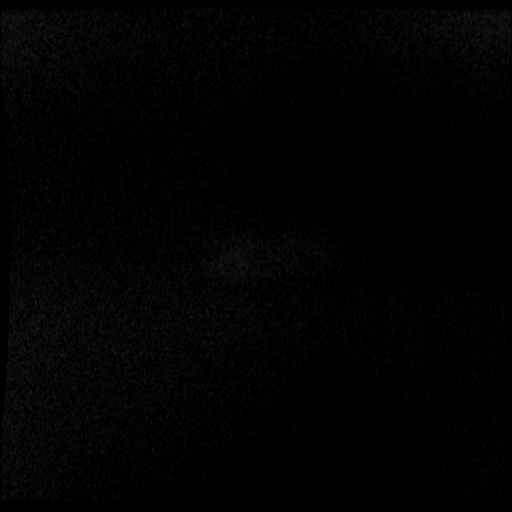
[im 39/39]
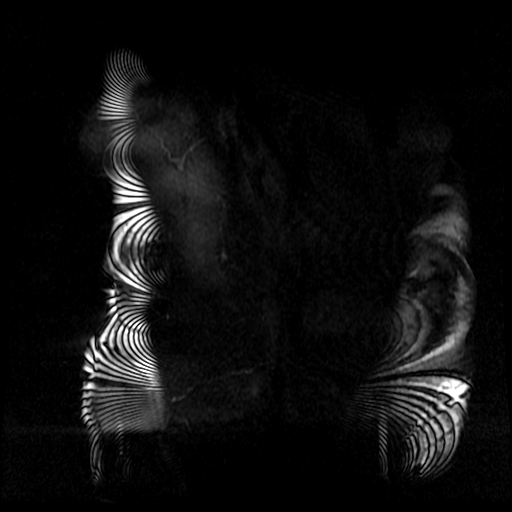

[Series 10: T2 · axial · 5.0mm · 0.86mm/px · 1 of 51 slices shown]
[im 1/51]
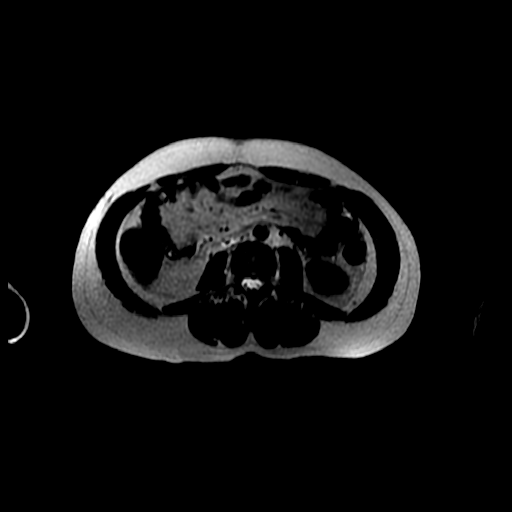

[Series 11: ax dualecho bh · axial · 5.0mm · 0.86mm/px · z∈[-91,+164]mm · 3 of 104 slices shown]
[im 1/104]
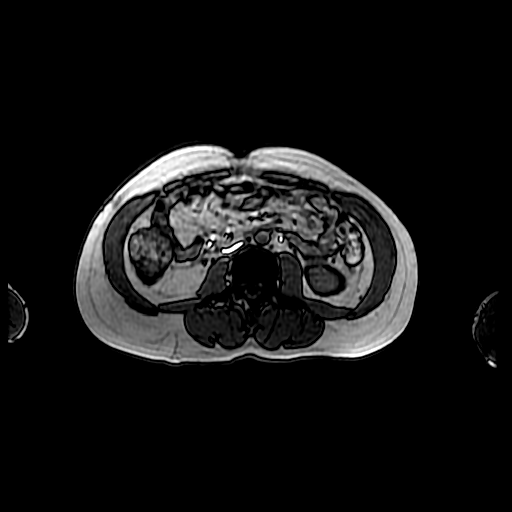
[im 52/104]
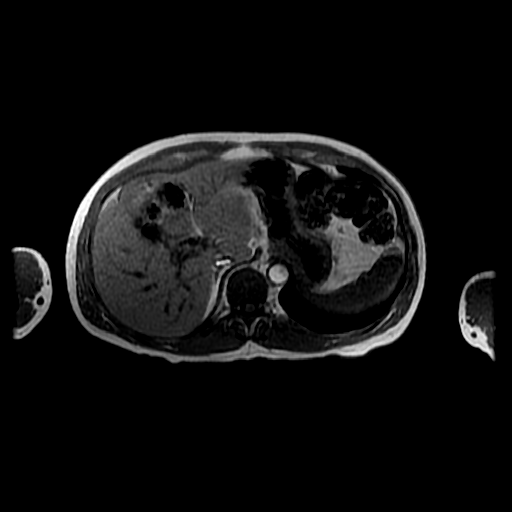
[im 104/104]
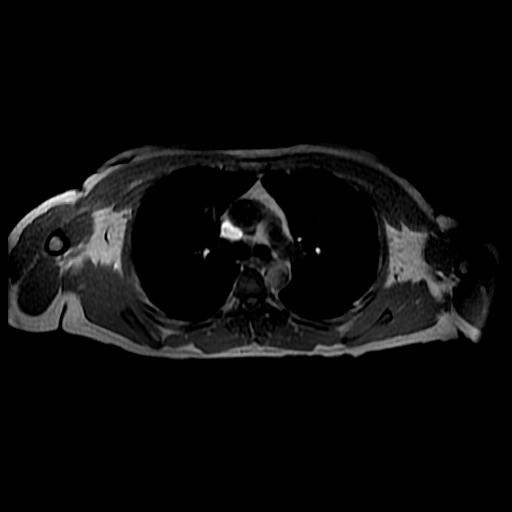

[Series 12: MRCP · coronal · 40.0mm · 0.70mm/px · 1 of 9 slices shown (2 of 2)]
[im 1/9]
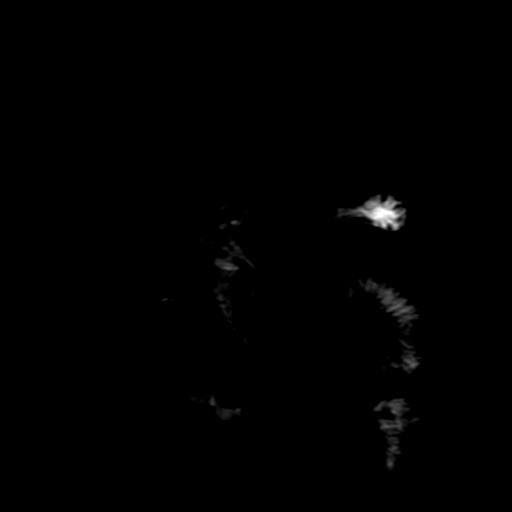

[Series 15: T1 dynamic · coronal · delayed · 8.0mm · 0.70mm/px · 2 of 72 slices shown]
[im 1/72]
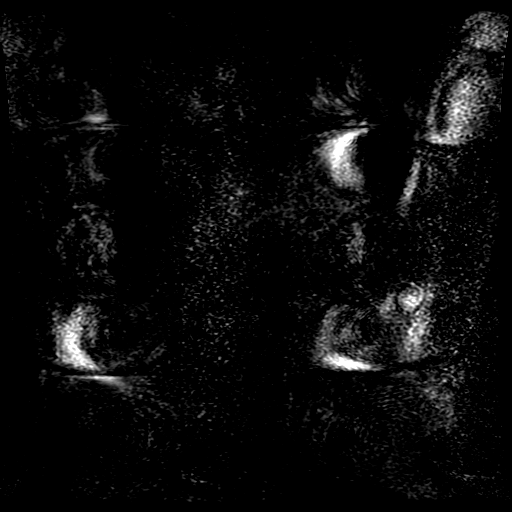
[im 72/72]
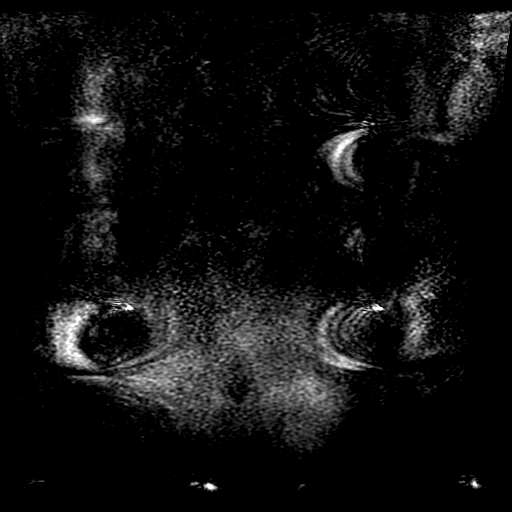

[Series 400: reformatted · axial · 1.6mm · 0.62mm/px · z∈[+9,+139]mm · 2 of 82 slices shown (1 of 2)]
[im 1/82]
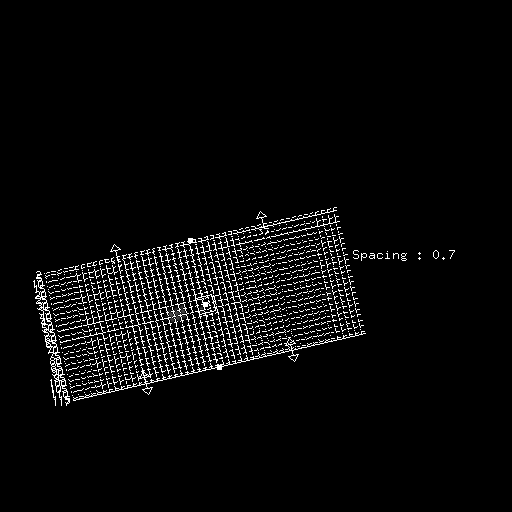
[im 82/82]
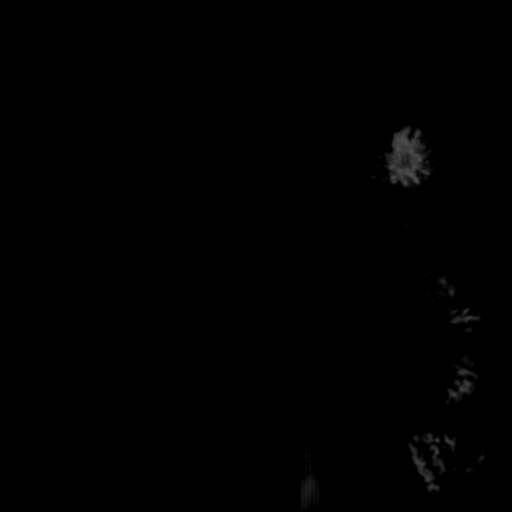

[Series 401: reformatted · coronal · 100.0mm · 0.51mm/px · 1 of 48 slices shown (2 of 2)]
[im 1/48]
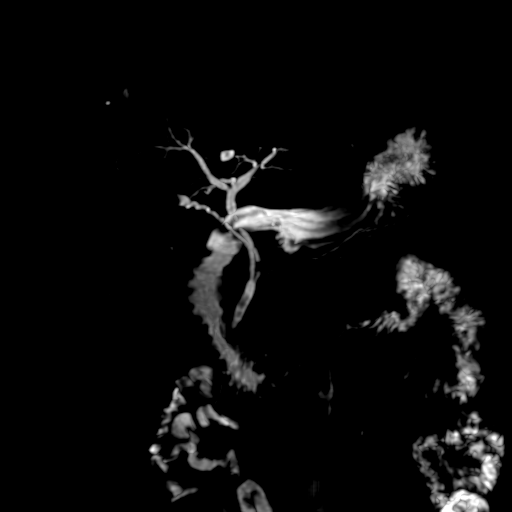

[Series 600: DWI · axial · 6.0mm · 1.76mm/px · 1 of 36 slices shown]
[im 1/36]
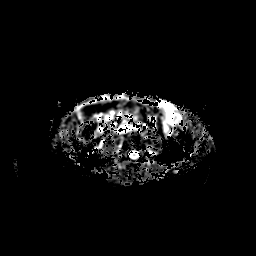

[21 of 48 positions shown; findings below may reference images not displayed]

FINDINGS: Lower chest: No acute findings.

Hepatobiliary: No hepatic masses identified. Sub-cm cyst seen near
the junction of the right and left lobes. Prior cholecystectomy. No
evidence of biliary ductal dilatation, with common bile duct
measuring 5 mm. Several sub-cm calculi are seen in the distal common
bile duct. A calculus is also seen within the cystic duct stump.

Pancreas: Acute pancreatitis is seen involving the pancreatic head,
decreased since previous study. A 2.9 cm fluid collection is seen in
the pancreatic head, consistent with a pancreatic pseudocyst. No
evidence of pancreatic ductal dilatation.

Spleen:  Within normal limits in size and appearance.

Adrenals/Urinary Tract: No masses identified. No evidence of
hydronephrosis.

Stomach/Bowel: Visualized portion unremarkable.

Vascular/Lymphatic: No pathologically enlarged lymph nodes
identified. No abdominal aortic aneurysm.

Other:  None.

Musculoskeletal:  No suspicious bone lesions identified.
IMPRESSION: Prior cholecystectomy. Multiple sub-cm calculi within distal common
bile duct and cystic duct stump. No significant biliary ductal
dilatation.

Decreased acute pancreatitis since prior exam. 2.9 cm fluid
collection in pancreatic head, consistent with pseudocyst.

## 2023-02-02 ENCOUNTER — Encounter (HOSPITAL_BASED_OUTPATIENT_CLINIC_OR_DEPARTMENT_OTHER): Payer: Self-pay | Admitting: Orthopaedic Surgery

## 2023-02-02 ENCOUNTER — Other Ambulatory Visit: Payer: Self-pay

## 2023-02-02 NOTE — Progress Notes (Signed)
Spoke w/ via phone for pre-op interview---[facility] needs dos---- none per anesthesia, surgeon orders pending              Lab results------none COVID test -----patient states asymptomatic no test needed Arrive at -------0830 on Monday, 02/05/23 NPO after MN NO Solid Food.  Clear liquids from MN until---0730 Med rec completed Medications to take morning of surgery -----Crestor, Flonase prn Diabetic medication -----n/a Patient instructed no nail polish to be worn day of surgery Patient instructed to bring photo id and insurance card day of surgery Patient aware to have Driver (ride ) / caregiver    for 24 hours after surgery - neighbor/ cousin Patient Special Instructions -----none Pre-Op special Instructions -----Orders pending, case just added at 1630 on 02/02/23. Patient verbalized understanding of instructions that were given at this phone interview. Patient denies shortness of breath, chest pain, fever, cough at this phone interview.

## 2023-02-04 NOTE — H&P (Signed)
ORTHOPAEDIC SURGERY H&P  Subjective:  The patient presents with left great toe ingrown nail with onychomycosis.   Past Medical History:  Diagnosis Date   Allergy    Arthritis    left knee   Bronchitis    Gallstones 2020   GERD (gastroesophageal reflux disease)    occasional   Hyperlipidemia    Follows w/ Dr. Thomasena Edis, PCP.   Pancreatitis 2020   Pneumonia    about 52 years old    Past Surgical History:  Procedure Laterality Date   BIOPSY  08/29/2018   Procedure: BIOPSY;  Surgeon: Rachael Fee, MD;  Location: [facility];  Service: Endoscopy;;   CERVICAL DISC SURGERY  2008   CHOLECYSTECTOMY N/A 07/15/2018   Procedure: LAPAROSCOPIC CHOLECYSTECTOMY WITH INTRAOPERATIVE CHOLANGIOGRAM;  Surgeon: Sheliah Hatch De Blanch, MD;  Location: [facility] OR;  Service: General;  Laterality: N/A;   ENDOSCOPIC RETROGRADE CHOLANGIOPANCREATOGRAPHY (ERCP) WITH PROPOFOL N/A 08/29/2018   Procedure: ENDOSCOPIC RETROGRADE CHOLANGIOPANCREATOGRAPHY (ERCP) WITH PROPOFOL;  Surgeon: Rachael Fee, MD;  Location: [facility];  Service: Endoscopy;  Laterality: N/A;   HIP SURGERY Right    as a child   REMOVAL OF STONES  08/29/2018   Procedure: REMOVAL OF STONES;  Surgeon: Rachael Fee, MD;  Location: [facility];  Service: Endoscopy;;   SHOULDER SURGERY Right    age 52, bone taken from right hip and put in shoulder   SPHINCTEROTOMY  08/29/2018   Procedure: SPHINCTEROTOMY;  Surgeon: Rachael Fee, MD;  Location: [facility];  Service: Endoscopy;;     (Not in an outpatient encounter)    Allergies  Allergen Reactions   Amoxicillin Rash    Did it involve swelling of the face/tongue/throat, SOB, or low BP? No Did it involve sudden or severe rash/hives, skin peeling, or any reaction on the inside of your mouth or nose? Yes Did you need to seek medical attention at a hospital or doctor's office? Yes When did it last happen?      2 + years If all above answers are "NO", may proceed with cephalosporin  use.    Penicillins Rash    Did it involve swelling of the face/tongue/throat, SOB, or low BP? No Did it involve sudden or severe rash/hives, skin peeling, or any reaction on the inside of your mouth or nose? Yes Did you need to seek medical attention at a hospital or doctor's office? Yes When did it last happen?      2+ years If all above answers are "NO", may proceed with cephalosporin use.     Social History   Socioeconomic History   Marital status: Single    Spouse name: Not on file   Number of children: 0   Years of education: Not on file   Highest education level: Not on file  Occupational History   Occupation: city of   Tobacco Use   Smoking status: Never   Smokeless tobacco: Never  Vaping Use   Vaping status: Never Used  Substance and Sexual Activity   Alcohol use: Not Currently   Drug use: No   Sexual activity: Not on file  Other Topics Concern   Not on file  Social History Narrative   Not on file   Social Determinants of Health   Financial Resource Strain: Not on file  Food Insecurity: Not on file  Transportation Needs: Not on file  Physical Activity: Not on file  Stress: Not on file  Social Connections: Not on file  Intimate Partner Violence: Not on  file     History reviewed. No pertinent family history.   Review of Systems Pertinent items are noted in HPI.  Objective: Vital signs in last 24 hours:    02/02/2023    5:00 PM 08/29/2018    1:20 PM 08/29/2018    1:10 PM  Vitals with BMI  Height 5\' 3"     Weight 136 lbs    BMI 24.1    Systolic  128 136  Diastolic  83 76  Pulse  88 94      EXAM: General: Well nourished, well developed. Awake, alert and oriented to time, place, person. Normal mood and affect. No apparent distress. Breathing room air.  Operative Lower Extremity: Alignment - Neutral Deformity - None Skin intact Tenderness to palpation - left great toe 5/5 TA, PT, GS, Per, EHL, FHL Sensation intact to light touch  throughout Palpable DP and PT pulses Special testing: None  The contralateral foot/ankle was examined for comparison and noted to be neurovascularly intact with no localized deformity, swelling, or tenderness.  Imaging Review All images taken were independently reviewed by me.  Assessment/Plan: The clinical and radiographic findings were reviewed and discussed at length with the patient.  The patient has left great toe nail with onychomycosis.  We spoke at length about the natural course of these findings. We discussed nonoperative and operative treatment options in detail.  The risks and benefits were presented and reviewed. The risks due to temporary/permanent nail loss, infection, stiffness, nerve/vessel/tendon injury, wound healing issues, failure of this surgery, need for further surgery, thromboembolic events, amputation, death among others were discussed. The patient acknowledged the explanation and agreed to proceed with the plan.  Harry Morales  Orthopaedic Surgery [facility]

## 2023-02-04 NOTE — Discharge Instructions (Addendum)
Netta Cedars, MD [facility]  Please read the following information regarding your care after surgery.  Medications  You only need a prescription for the narcotic pain medicine (ex. oxycodone, Percocet, Norco).  All of the other medicines listed below are available over the counter. ? ANTIBIOTICS as prescribed ? Aleve 2 pills twice a day for the first 3 days after surgery. ? acetominophen (Tylenol) 650 mg every 4-6 hours as you need for minor to moderate pain ? oxycodone as prescribed for severe pain  ? To help prevent blood clots, you should get up every hour while you are awake to move around.  Weight Bearing ? OK to heel weightbear on the operated leg or foot. This means do NOT touch the front of your surgical foot to the ground!  Cast / Splint / Dressing ? If you have a dressing, do NOT remove this. Keep your splint, cast or dressing clean and dry.  Don't put anything (coat hanger, pencil, etc) down inside of it.  If it gets wet, call the office immediately to schedule an appointment for a cast change.  Swelling IMPORTANT: It is normal for you to have swelling where you had surgery. To reduce swelling and pain, keep at least 3 pillows under your leg so that your toes are above your nose and your heel is above the level of your hip.  It may be necessary to keep your foot or leg elevated for several weeks.  This is critical to helping your incisions heal and your pain to feel better.  Follow Up Call my office at [phone] when you are discharged from the hospital or surgery center to schedule an appointment to be seen 7-10 days after surgery.  Call my office at [phone] if you develop a fever >101.5 F, nausea, vomiting, bleeding from the surgical site or severe pain.

## 2023-02-05 ENCOUNTER — Other Ambulatory Visit: Payer: Self-pay

## 2023-02-05 ENCOUNTER — Ambulatory Visit (HOSPITAL_BASED_OUTPATIENT_CLINIC_OR_DEPARTMENT_OTHER): Payer: 59 | Admitting: Anesthesiology

## 2023-02-05 ENCOUNTER — Encounter (HOSPITAL_BASED_OUTPATIENT_CLINIC_OR_DEPARTMENT_OTHER)
Admission: RE | Disposition: A | Discharge status: Home / Self Care | Payer: Self-pay | Source: Home / Self Care | Attending: Orthopaedic Surgery

## 2023-02-05 ENCOUNTER — Encounter (HOSPITAL_BASED_OUTPATIENT_CLINIC_OR_DEPARTMENT_OTHER): Payer: Self-pay | Admitting: Orthopaedic Surgery

## 2023-02-05 ENCOUNTER — Ambulatory Visit (HOSPITAL_BASED_OUTPATIENT_CLINIC_OR_DEPARTMENT_OTHER)
Admission: RE | Admit: 2023-02-05 | Discharge: 2023-02-05 | Disposition: A | Discharge status: Home / Self Care | Payer: 59 | Attending: Orthopaedic Surgery | Admitting: Orthopaedic Surgery

## 2023-02-05 DIAGNOSIS — L02612 Cutaneous abscess of left foot: Secondary | ICD-10-CM

## 2023-02-05 DIAGNOSIS — L600 Ingrowing nail: Secondary | ICD-10-CM

## 2023-02-05 DIAGNOSIS — I10 Essential (primary) hypertension: Secondary | ICD-10-CM | POA: Insufficient documentation

## 2023-02-05 DIAGNOSIS — B351 Tinea unguium: Secondary | ICD-10-CM | POA: Insufficient documentation

## 2023-02-05 DIAGNOSIS — Z01818 Encounter for other preprocedural examination: Secondary | ICD-10-CM

## 2023-02-05 HISTORY — PX: DIGIT NAIL REMOVAL: SHX5052

## 2023-02-05 HISTORY — DX: Hyperlipidemia, unspecified: E78.5

## 2023-02-05 HISTORY — DX: Unspecified osteoarthritis, unspecified site: M19.90

## 2023-02-05 HISTORY — PX: I & D EXTREMITY: SHX5045

## 2023-02-05 SURGERY — REMOVAL, DIGIT NAIL
Anesthesia: Monitor Anesthesia Care | Laterality: Left

## 2023-02-05 MED ORDER — PROPOFOL 1000 MG/100ML IV EMUL
INTRAVENOUS | Status: AC
  Filled 2023-02-05: qty 100

## 2023-02-05 MED ORDER — MIDAZOLAM HCL 2 MG/2ML IJ SOLN
INTRAMUSCULAR | Status: AC
  Filled 2023-02-05: qty 2

## 2023-02-05 MED ORDER — PROPOFOL 10 MG/ML IV BOLUS
INTRAVENOUS | Status: DC | PRN
Start: 2023-02-05 — End: 2023-02-05
  Administered 2023-02-05: 20 mg via INTRAVENOUS

## 2023-02-05 MED ORDER — FENTANYL CITRATE (PF) 100 MCG/2ML IJ SOLN
INTRAMUSCULAR | Status: DC | PRN
  Administered 2023-02-05 (×2): 25 ug via INTRAVENOUS
  Administered 2023-02-05: 50 ug via INTRAVENOUS

## 2023-02-05 MED ORDER — LACTATED RINGERS IV SOLN: INTRAVENOUS | Status: DC

## 2023-02-05 MED ORDER — PROPOFOL 500 MG/50ML IV EMUL
INTRAVENOUS | Status: DC | PRN
  Administered 2023-02-05: 125 ug/kg/min via INTRAVENOUS

## 2023-02-05 MED ORDER — CHLORHEXIDINE GLUCONATE 4 % EX SOLN: 60.0000 mL | Freq: Once | CUTANEOUS | Status: DC

## 2023-02-05 MED ORDER — FENTANYL CITRATE (PF) 100 MCG/2ML IJ SOLN
INTRAMUSCULAR | Status: AC
  Filled 2023-02-05: qty 2

## 2023-02-05 MED ORDER — ONDANSETRON HCL 4 MG/2ML IJ SOLN
INTRAMUSCULAR | Status: DC | PRN
  Administered 2023-02-05: 4 mg via INTRAVENOUS

## 2023-02-05 MED ORDER — BUPIVACAINE HCL (PF) 0.25 % IJ SOLN
INTRAMUSCULAR | Status: DC | PRN
  Administered 2023-02-05: 10 mL

## 2023-02-05 MED ORDER — CEFAZOLIN SODIUM-DEXTROSE 2-4 GM/100ML-% IV SOLN
2.0000 g | INTRAVENOUS | Status: AC
  Administered 2023-02-05: 2 g via INTRAVENOUS

## 2023-02-05 MED ORDER — MIDAZOLAM HCL 5 MG/5ML IJ SOLN
INTRAMUSCULAR | Status: DC | PRN
  Administered 2023-02-05: 2 mg via INTRAVENOUS

## 2023-02-05 MED ORDER — CEFAZOLIN SODIUM-DEXTROSE 2-4 GM/100ML-% IV SOLN
INTRAVENOUS | Status: AC
  Filled 2023-02-05: qty 100

## 2023-02-05 MED ORDER — DEXAMETHASONE SODIUM PHOSPHATE 10 MG/ML IJ SOLN
INTRAMUSCULAR | Status: DC | PRN
  Administered 2023-02-05: 10 mg via INTRAVENOUS

## 2023-02-05 MED ORDER — LIDOCAINE HCL (PF) 2 % IJ SOLN
INTRAMUSCULAR | Status: AC
  Filled 2023-02-05: qty 5

## 2023-02-05 SURGICAL SUPPLY — 55 items
APL PRP STRL LF DISP 70% ISPRP (MISCELLANEOUS) ×1
BANDAGE ESMARK 6X9 LF (GAUZE/BANDAGES/DRESSINGS) IMPLANT
BLADE SURG 10 STRL SS (BLADE) IMPLANT
BLADE SURG 15 STRL LF DISP TIS (BLADE) ×1 IMPLANT
BLADE SURG 15 STRL SS (BLADE) ×1
BNDG CMPR 5X4 KNIT ELC UNQ LF (GAUZE/BANDAGES/DRESSINGS) ×1
BNDG CMPR 6 X 5 YARDS HK CLSR (GAUZE/BANDAGES/DRESSINGS)
BNDG CMPR 9X6 STRL LF SNTH (GAUZE/BANDAGES/DRESSINGS)
BNDG ELASTIC 4INX 5YD STR LF (GAUZE/BANDAGES/DRESSINGS) IMPLANT
BNDG ELASTIC 6INX 5YD STR LF (GAUZE/BANDAGES/DRESSINGS) IMPLANT
BNDG ESMARK 6X9 LF (GAUZE/BANDAGES/DRESSINGS)
BNDG GAUZE DERMACEA FLUFF 4 (GAUZE/BANDAGES/DRESSINGS) IMPLANT
BNDG GZE DERMACEA 4 6PLY (GAUZE/BANDAGES/DRESSINGS) ×1
BRUSH SCRUB EZ 4% CHG (MISCELLANEOUS) ×1 IMPLANT
CHLORAPREP W/TINT 26 (MISCELLANEOUS) ×1 IMPLANT
COVER MAYO STAND STRL (DRAPES) IMPLANT
CUFF TOURN SGL QUICK 34 (TOURNIQUET CUFF) ×1
CUFF TRNQT CYL 34X4.125X (TOURNIQUET CUFF) ×1 IMPLANT
DRAPE C-ARM 35X43 STRL (DRAPES) IMPLANT
DRAPE C-ARM 42X120 X-RAY (DRAPES) IMPLANT
DRAPE EXTREMITY T 121X128X90 (DISPOSABLE) IMPLANT
DRAPE INCISE IOBAN 66X45 STRL (DRAPES) ×1 IMPLANT
DRAPE SHEET LG 3/4 BI-LAMINATE (DRAPES) ×1 IMPLANT
DRAPE U-SHAPE 47X51 STRL (DRAPES) ×1 IMPLANT
DRSG ADAPTIC 3X8 NADH LF (GAUZE/BANDAGES/DRESSINGS) IMPLANT
DRSG EMULSION OIL 3X3 NADH (GAUZE/BANDAGES/DRESSINGS) IMPLANT
ELECT REM PT RETURN 9FT ADLT (ELECTROSURGICAL) ×1
ELECTRODE REM PT RTRN 9FT ADLT (ELECTROSURGICAL) ×1 IMPLANT
GAUZE 4X4 16PLY ~~LOC~~+RFID DBL (SPONGE) ×1 IMPLANT
GAUZE PAD ABD 8X10 STRL (GAUZE/BANDAGES/DRESSINGS) IMPLANT
GAUZE SPONGE 4X4 12PLY STRL (GAUZE/BANDAGES/DRESSINGS) ×1 IMPLANT
GLOVE BIO SURGEON STRL SZ7.5 (GLOVE) ×2 IMPLANT
GLOVE BIOGEL PI IND STRL 8 (GLOVE) ×2 IMPLANT
GOWN STRL REUS W/TWL XL LVL3 (GOWN DISPOSABLE) ×2 IMPLANT
KIT TURNOVER CYSTO (KITS) ×1 IMPLANT
NDL HYPO 22X1.5 SAFETY MO (MISCELLANEOUS) IMPLANT
NEEDLE HYPO 22X1.5 SAFETY MO (MISCELLANEOUS) IMPLANT
NS IRRIG 500ML POUR BTL (IV SOLUTION) ×1 IMPLANT
PACK ARTHROSCOPY DSU (CUSTOM PROCEDURE TRAY) ×1 IMPLANT
PACK BASIN DAY SURGERY FS (CUSTOM PROCEDURE TRAY) ×1 IMPLANT
PAD ALCOHOL SWAB (MISCELLANEOUS) ×10 IMPLANT
PADDING CAST COTTON 6X4 STRL (CAST SUPPLIES) IMPLANT
PENCIL SMOKE EVACUATOR (MISCELLANEOUS) ×1 IMPLANT
SLEEVE SCD COMPRESS KNEE MED (STOCKING) ×1 IMPLANT
SPONGE T-LAP 4X18 ~~LOC~~+RFID (SPONGE) IMPLANT
STOCKINETTE 6 STRL (DRAPES) IMPLANT
STRIP CLOSURE SKIN 1/2X4 (GAUZE/BANDAGES/DRESSINGS) ×1 IMPLANT
SUT MNCRL AB 3-0 PS2 27 (SUTURE) IMPLANT
SUT VIC AB 1 CT1 36 (SUTURE) IMPLANT
SUT VIC AB 2-0 CT1 27 (SUTURE)
SUT VIC AB 2-0 CT1 TAPERPNT 27 (SUTURE) IMPLANT
SYR CONTROL 10ML LL (SYRINGE) IMPLANT
TOWEL OR 17X24 6PK STRL BLUE (TOWEL DISPOSABLE) ×1 IMPLANT
TUBE CONNECTING 12X1/4 (SUCTIONS) ×1 IMPLANT
YANKAUER SUCT BULB TIP NO VENT (SUCTIONS) ×1 IMPLANT

## 2023-02-05 NOTE — Anesthesia Preprocedure Evaluation (Signed)
Anesthesia Evaluation  Patient identified by MRN, date of birth, ID band Patient awake    Reviewed: Allergy & Precautions, NPO status , Patient's Chart, lab work & pertinent test results, reviewed documented beta blocker date and time   History of Anesthesia Complications Negative for: history of anesthetic complications  Airway Mallampati: III     Mouth opening: Limited Mouth Opening  Dental  (+) Upper Dentures   Pulmonary neg COPD, neg PE   breath sounds clear to auscultation       Cardiovascular hypertension, (-) CAD, (-) Past MI, (-) Cardiac Stents, (-) CABG and (-) CHF (-) dysrhythmias (-) pacemaker(-) Cardiac Defibrillator (-) Valvular Problems/Murmurs Rhythm:Regular Rate:Normal     Neuro/Psych  Headaches, neg Seizures    GI/Hepatic ,GERD  Controlled,,  Endo/Other  neg diabetes    Renal/GU Renal disease     Musculoskeletal  (+) Arthritis ,    Abdominal   Peds  Hematology   Anesthesia Other Findings   Reproductive/Obstetrics                              Anesthesia Physical Anesthesia Plan  ASA: 2  Anesthesia Plan: MAC   Post-op Pain Management:    Induction: Intravenous  PONV Risk Score and Plan: 1 and Ondansetron  Airway Management Planned:   Additional Equipment:   Intra-op Plan:   Post-operative Plan:   Informed Consent: I have reviewed the patients History and Physical, chart, labs and discussed the procedure including the risks, benefits and alternatives for the proposed anesthesia with the patient or authorized representative who has indicated his/her understanding and acceptance.     Dental advisory given  Plan Discussed with: CRNA  Anesthesia Plan Comments:          Anesthesia Quick Evaluation

## 2023-02-05 NOTE — H&P (Signed)
H&P Update:  -History and Physical Reviewed  -Patient has been re-examined  -No change in the plan of care  -The risks and benefits were presented and reviewed. The risks due to temporary/permanent nail loss, infection, stiffness, nerve/vessel/tendon injury, wound healing issues, failure of this surgery, need for further surgery, thromboembolic events, amputation, death among others were discussed . The patient acknowledged the explanation, agreed to proceed with the plan and a consent was signed.  Harry Morales

## 2023-02-05 NOTE — Op Note (Signed)
02/05/2023  10:16 PM   PATIENT: Harry Morales  52 y.o. male  MRN: [id number]   PRE-OPERATIVE DIAGNOSIS:   Left great toe ingrown nail with onychomycosis and forefoot abscess   POST-OPERATIVE DIAGNOSIS:   Same   PROCEDURE: 1] Left great toe nail partial removal for treatment of severe onychomycosis and ingrown nail 2] Left foot irrigation and debridement   SURGEON:  Netta Cedars, MD   ASSISTANT: None   ANESTHESIA: General, regional   EBL: Minimal   TOURNIQUET:   None used   COMPLICATIONS: None apparent   DISPOSITION: Extubated, awake and stable to recovery.   INDICATION FOR PROCEDURE: The patient presented with above diagnosis.  We discussed the diagnosis, alternative treatment options, risks and benefits of the above surgical intervention, as well as alternative non-operative treatments. All questions/concerns were addressed and the patient/family demonstrated appropriate understanding of the diagnosis, the procedure, the postoperative course, and overall prognosis. The patient wished to proceed with surgical intervention and signed an informed surgical consent as such, in each others presence prior to surgery.   PROCEDURE IN DETAIL: After preoperative consent was obtained and the correct operative site was identified, the patient was brought to the operating room supine on stretcher and transferred onto operating table. General anesthesia was induced. Preoperative antibiotics were administered. Surgical timeout was taken. The patient was then positioned supine with an ipsilateral hip bump. The operative lower extremity was prepped and draped in standard sterile fashion. No tourniquet was used.  We began by excising the thickened distal nail fold of the left great toe and freeing the ingrown edges on either side. We then made a separate incision along the left great toe to perform a formal irrigation and debridement of this area due to known purulence.  This area was copiously irrigated with saline and betadine. Vancomycin powder were applied. The debridement site was left open to drain and heal by secondary intention.   The leg was cleaned with saline and sterile dressings with gauze were applied. A well padded loose wrap was applied. The patient was awakened from anesthesia and transported to the recovery room in stable condition.    FOLLOW UP PLAN: -transfer to PACU, then home -heel WB operative extremity, maximum elevation -continue Keflex rx as prescribed -follow up as outpatient within 7-10 days for wound check   RADIOGRAPHS: None used   Netta Cedars Orthopaedic Surgery [facility]

## 2023-02-05 NOTE — Transfer of Care (Signed)
Immediate Anesthesia Transfer of Care Note  Patient: Harry Morales  Procedure(s) Performed: left great toe nail partial (Left) MINOR IRRIGATION AND DEBRIDEMENT Left great toe (Left)  Patient Location: PACU  Anesthesia Type:MAC  Level of Consciousness: awake, alert , and oriented  Airway & Oxygen Therapy: Patient Spontanous Breathing and Patient connected to face mask oxygen  Post-op Assessment: Report given to RN and Post -op Vital signs reviewed and stable  Post vital signs: Reviewed and stable  Last Vitals:  Vitals Value Taken Time  BP    Temp    Pulse 75 02/05/23 1124  Resp 13 02/05/23 1124  SpO2 100 % 02/05/23 1124  Vitals shown include unfiled device data.  Last Pain:  Vitals:   02/05/23 0852  TempSrc: Oral  PainSc: 0-No pain      Patients Stated Pain Goal: 7 (02/05/23 7829)  Complications: No notable events documented.

## 2023-02-05 NOTE — Anesthesia Postprocedure Evaluation (Signed)
Anesthesia Post Note  Patient: Shreyas Dickhaut Colgan  Procedure(s) Performed: left great toe nail partial (Left) MINOR IRRIGATION AND DEBRIDEMENT Left great toe (Left)     Patient location during evaluation: PACU Anesthesia Type: MAC Level of consciousness: awake and alert Pain management: pain level controlled Vital Signs Assessment: post-procedure vital signs reviewed and stable Respiratory status: spontaneous breathing, nonlabored ventilation, respiratory function stable and patient connected to nasal cannula oxygen Cardiovascular status: stable and blood pressure returned to baseline Postop Assessment: no apparent nausea or vomiting Anesthetic complications: no   No notable events documented.  Last Vitals:  Vitals:   02/05/23 1121 02/05/23 1130  BP: 99/72 94/70  Pulse: 76 75  Resp: 13 14  Temp: 36.5 C   SpO2: 100% 100%    Last Pain:  Vitals:   02/05/23 1121  TempSrc:   PainSc: Asleep                 Mariann Barter

## 2023-02-06 ENCOUNTER — Encounter (HOSPITAL_BASED_OUTPATIENT_CLINIC_OR_DEPARTMENT_OTHER): Payer: Self-pay | Admitting: Orthopaedic Surgery

## 2023-02-08 ENCOUNTER — Ambulatory Visit: Payer: 59 | Admitting: Podiatry

## 2023-03-19 ENCOUNTER — Emergency Department (HOSPITAL_COMMUNITY): Payer: 59

## 2023-03-19 ENCOUNTER — Inpatient Hospital Stay (HOSPITAL_COMMUNITY)
Admission: EM | Admit: 2023-03-19 | Discharge: 2023-03-30 | DRG: 488 | Disposition: A | Discharge status: Home Health | Payer: 59 | Attending: Orthopedic Surgery | Admitting: Orthopedic Surgery

## 2023-03-19 ENCOUNTER — Encounter (HOSPITAL_COMMUNITY): Payer: Self-pay

## 2023-03-19 DIAGNOSIS — Z79899 Other long term (current) drug therapy: Secondary | ICD-10-CM

## 2023-03-19 DIAGNOSIS — S83282A Other tear of lateral meniscus, current injury, left knee, initial encounter: Secondary | ICD-10-CM | POA: Diagnosis present

## 2023-03-19 DIAGNOSIS — K5900 Constipation, unspecified: Secondary | ICD-10-CM | POA: Diagnosis not present

## 2023-03-19 DIAGNOSIS — E785 Hyperlipidemia, unspecified: Secondary | ICD-10-CM | POA: Diagnosis present

## 2023-03-19 DIAGNOSIS — Z6372 Alcoholism and drug addiction in family: Secondary | ICD-10-CM | POA: Diagnosis not present

## 2023-03-19 DIAGNOSIS — Z9049 Acquired absence of other specified parts of digestive tract: Secondary | ICD-10-CM | POA: Diagnosis not present

## 2023-03-19 DIAGNOSIS — D62 Acute posthemorrhagic anemia: Secondary | ICD-10-CM | POA: Diagnosis present

## 2023-03-19 DIAGNOSIS — S82132A Displaced fracture of medial condyle of left tibia, initial encounter for closed fracture: Secondary | ICD-10-CM | POA: Diagnosis present

## 2023-03-19 DIAGNOSIS — S82142A Displaced bicondylar fracture of left tibia, initial encounter for closed fracture: Secondary | ICD-10-CM | POA: Diagnosis present

## 2023-03-19 DIAGNOSIS — M898X9 Other specified disorders of bone, unspecified site: Secondary | ICD-10-CM | POA: Diagnosis present

## 2023-03-19 DIAGNOSIS — S42202A Unspecified fracture of upper end of left humerus, initial encounter for closed fracture: Secondary | ICD-10-CM | POA: Diagnosis not present

## 2023-03-19 DIAGNOSIS — Z880 Allergy status to penicillin: Secondary | ICD-10-CM | POA: Diagnosis not present

## 2023-03-19 DIAGNOSIS — Z820 Family history of epilepsy and other diseases of the nervous system: Secondary | ICD-10-CM | POA: Diagnosis not present

## 2023-03-19 DIAGNOSIS — Z7982 Long term (current) use of aspirin: Secondary | ICD-10-CM

## 2023-03-19 DIAGNOSIS — J45909 Unspecified asthma, uncomplicated: Secondary | ICD-10-CM | POA: Diagnosis present

## 2023-03-19 DIAGNOSIS — Y92410 Unspecified street and highway as the place of occurrence of the external cause: Secondary | ICD-10-CM | POA: Diagnosis not present

## 2023-03-19 DIAGNOSIS — F32A Depression, unspecified: Secondary | ICD-10-CM | POA: Diagnosis present

## 2023-03-19 DIAGNOSIS — Z811 Family history of alcohol abuse and dependence: Secondary | ICD-10-CM

## 2023-03-19 DIAGNOSIS — S42292A Other displaced fracture of upper end of left humerus, initial encounter for closed fracture: Secondary | ICD-10-CM | POA: Diagnosis present

## 2023-03-19 DIAGNOSIS — S82102A Unspecified fracture of upper end of left tibia, initial encounter for closed fracture: Secondary | ICD-10-CM | POA: Diagnosis not present

## 2023-03-19 DIAGNOSIS — Z8249 Family history of ischemic heart disease and other diseases of the circulatory system: Secondary | ICD-10-CM

## 2023-03-19 LAB — COMPREHENSIVE METABOLIC PANEL
ALT: 48 U/L — ABNORMAL HIGH (ref 0–44)
AST: 40 U/L (ref 15–41)
Albumin: 4.5 g/dL (ref 3.5–5.0)
Alkaline Phosphatase: 77 U/L (ref 38–126)
Anion gap: 10 (ref 5–15)
BUN: 11 mg/dL (ref 6–20)
CO2: 25 mmol/L (ref 22–32)
Calcium: 8.8 mg/dL — ABNORMAL LOW (ref 8.9–10.3)
Chloride: 101 mmol/L (ref 98–111)
Creatinine, Ser: 1.13 mg/dL (ref 0.61–1.24)
GFR, Estimated: >60 mL/min (ref >60)
Glucose, Bld: 123 mg/dL — ABNORMAL HIGH (ref 70–99)
Potassium: 3.3 mmol/L — ABNORMAL LOW (ref 3.5–5.1)
Sodium: 136 mmol/L (ref 135–145)
Total Bilirubin: 0.6 mg/dL (ref 0.3–1.2)
Total Protein: 7.5 g/dL (ref 6.5–8.1)

## 2023-03-19 LAB — CBC WITH DIFFERENTIAL/PLATELET
Abs Immature Granulocytes: 0.14 10*3/uL — ABNORMAL HIGH (ref 0.00–0.07)
Basophils Absolute: 0.1 10*3/uL (ref 0.0–0.1)
Basophils Relative: 0 %
Eosinophils Absolute: 0 10*3/uL (ref 0.0–0.5)
Eosinophils Relative: 0 %
HCT: 48.7 % (ref 39.0–52.0)
Hemoglobin: 16 g/dL (ref 13.0–17.0)
Immature Granulocytes: 1 %
Lymphocytes Relative: 5 %
Lymphs Abs: 1.1 10*3/uL (ref 0.7–4.0)
MCH: 29.3 pg (ref 26.0–34.0)
MCHC: 32.9 g/dL (ref 30.0–36.0)
MCV: 89 fL (ref 80.0–100.0)
Monocytes Absolute: 1 10*3/uL (ref 0.1–1.0)
Monocytes Relative: 5 %
Neutro Abs: 18.2 10*3/uL — ABNORMAL HIGH (ref 1.7–7.7)
Neutrophils Relative %: 89 %
Platelets: 276 10*3/uL (ref 150–400)
RBC: 5.47 MIL/uL (ref 4.22–5.81)
RDW: 13.1 % (ref 11.5–15.5)
WBC: 20.5 10*3/uL — ABNORMAL HIGH (ref 4.0–10.5)
nRBC: 0 % (ref 0.0–0.2)

## 2023-03-19 LAB — I-STAT CHEM 8, ED
BUN: 10 mg/dL (ref 6–20)
Calcium, Ion: 1.13 mmol/L — ABNORMAL LOW (ref 1.15–1.40)
Chloride: 101 mmol/L (ref 98–111)
Creatinine, Ser: 1.1 mg/dL (ref 0.61–1.24)
Glucose, Bld: 123 mg/dL — ABNORMAL HIGH (ref 70–99)
HCT: 48 % (ref 39.0–52.0)
Hemoglobin: 16.3 g/dL (ref 13.0–17.0)
Potassium: 3.5 mmol/L (ref 3.5–5.1)
Sodium: 139 mmol/L (ref 135–145)
TCO2: 24 mmol/L (ref 22–32)

## 2023-03-19 MED ORDER — ZOLPIDEM TARTRATE 5 MG PO TABS: 5.0000 mg | ORAL_TABLET | Freq: Every evening | ORAL | Status: DC | PRN

## 2023-03-19 MED ORDER — OXYCODONE HCL 5 MG PO TABS
5.0000 mg | ORAL_TABLET | ORAL | Status: DC | PRN
  Administered 2023-03-20: 5 mg via ORAL
  Filled 2023-03-19: qty 1

## 2023-03-19 MED ORDER — ONDANSETRON HCL 4 MG PO TABS: 4.0000 mg | ORAL_TABLET | Freq: Four times a day (QID) | ORAL | Status: DC | PRN

## 2023-03-19 MED ORDER — MORPHINE SULFATE (PF) 4 MG/ML IV SOLN
4.0000 mg | Freq: Once | INTRAVENOUS | Status: AC
  Administered 2023-03-19: 4 mg via INTRAVENOUS
  Filled 2023-03-19: qty 1

## 2023-03-19 MED ORDER — ACETAMINOPHEN 325 MG PO TABS: 650.0000 mg | ORAL_TABLET | Freq: Four times a day (QID) | ORAL | Status: DC | PRN

## 2023-03-19 MED ORDER — SODIUM CHLORIDE 0.9% FLUSH
3.0000 mL | Freq: Two times a day (BID) | INTRAVENOUS | Status: DC
  Administered 2023-03-19: 3 mL via INTRAVENOUS

## 2023-03-19 MED ORDER — SODIUM CHLORIDE 0.9% FLUSH: 3.0000 mL | INTRAVENOUS | Status: DC | PRN

## 2023-03-19 MED ORDER — METHOCARBAMOL 1000 MG/10ML IJ SOLN: 500.0000 mg | Freq: Four times a day (QID) | INTRAVENOUS | Status: DC | PRN

## 2023-03-19 MED ORDER — HYDROMORPHONE HCL 1 MG/ML IJ SOLN
0.5000 mg | INTRAMUSCULAR | Status: DC | PRN
  Administered 2023-03-20: 1 mg via INTRAVENOUS
  Administered 2023-03-20: .5 mg via INTRAVENOUS
  Administered 2023-03-20: 1 mg via INTRAVENOUS
  Administered 2023-03-20: .5 mg via INTRAVENOUS
  Filled 2023-03-19 (×2): qty 1

## 2023-03-19 MED ORDER — SODIUM CHLORIDE 0.9 % IV SOLN: 250.0000 mL | INTRAVENOUS | Status: DC | PRN

## 2023-03-19 MED ORDER — DOCUSATE SODIUM 100 MG PO CAPS
100.0000 mg | ORAL_CAPSULE | Freq: Two times a day (BID) | ORAL | Status: DC
  Administered 2023-03-20: 100 mg via ORAL
  Filled 2023-03-19: qty 1

## 2023-03-19 MED ORDER — POLYETHYLENE GLYCOL 3350 17 G PO PACK: 17.0000 g | PACK | Freq: Every day | ORAL | Status: DC | PRN

## 2023-03-19 MED ORDER — ONDANSETRON HCL 4 MG/2ML IJ SOLN: 4.0000 mg | Freq: Four times a day (QID) | INTRAMUSCULAR | Status: DC | PRN

## 2023-03-19 MED ORDER — ACETAMINOPHEN 650 MG RE SUPP: 650.0000 mg | Freq: Four times a day (QID) | RECTAL | Status: DC | PRN

## 2023-03-19 MED ORDER — SODIUM CHLORIDE 0.9 % IV SOLN: INTRAVENOUS | Status: DC

## 2023-03-19 MED ORDER — IOHEXOL 300 MG/ML  SOLN
100.0000 mL | Freq: Once | INTRAMUSCULAR | Status: AC | PRN
  Administered 2023-03-19: 100 mL via INTRAVENOUS
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
## 2023-03-20 ENCOUNTER — Encounter (HOSPITAL_COMMUNITY): Payer: Self-pay | Admitting: Obstetrics & Gynecology

## 2023-03-20 ENCOUNTER — Inpatient Hospital Stay (HOSPITAL_COMMUNITY): Payer: 59

## 2023-03-20 ENCOUNTER — Inpatient Hospital Stay (HOSPITAL_COMMUNITY): Payer: 59 | Admitting: Anesthesiology

## 2023-03-20 ENCOUNTER — Encounter (HOSPITAL_COMMUNITY)
Admission: EM | Disposition: A | Discharge status: Home Health | Payer: Self-pay | Source: Home / Self Care | Attending: Orthopedic Surgery

## 2023-03-20 ENCOUNTER — Other Ambulatory Visit: Payer: Self-pay

## 2023-03-20 DIAGNOSIS — S82142A Displaced bicondylar fracture of left tibia, initial encounter for closed fracture: Secondary | ICD-10-CM

## 2023-03-20 DIAGNOSIS — S42202A Unspecified fracture of upper end of left humerus, initial encounter for closed fracture: Secondary | ICD-10-CM | POA: Diagnosis not present

## 2023-03-20 DIAGNOSIS — S82102A Unspecified fracture of upper end of left tibia, initial encounter for closed fracture: Secondary | ICD-10-CM | POA: Diagnosis not present

## 2023-03-20 HISTORY — PX: EXTERNAL FIXATION LEG: SHX1549

## 2023-03-20 HISTORY — PX: ORIF HUMERUS FRACTURE: SHX2126

## 2023-03-20 HISTORY — DX: Displaced bicondylar fracture of left tibia, initial encounter for closed fracture: S82.142A

## 2023-03-20 LAB — CBC
HCT: 41.5 % (ref 39.0–52.0)
Hemoglobin: 13.8 g/dL (ref 13.0–17.0)
MCH: 29.4 pg (ref 26.0–34.0)
MCHC: 33.3 g/dL (ref 30.0–36.0)
MCV: 88.3 fL (ref 80.0–100.0)
Platelets: 249 10*3/uL (ref 150–400)
RBC: 4.7 MIL/uL (ref 4.22–5.81)
RDW: 13.1 % (ref 11.5–15.5)
WBC: 17.3 10*3/uL — ABNORMAL HIGH (ref 4.0–10.5)
nRBC: 0 % (ref 0.0–0.2)

## 2023-03-20 LAB — HIV ANTIBODY (ROUTINE TESTING W REFLEX): HIV Screen 4th Generation wRfx: NONREACTIVE

## 2023-03-20 LAB — CREATININE, SERUM
Creatinine, Ser: 1.24 mg/dL (ref 0.61–1.24)
GFR, Estimated: >60 mL/min (ref >60)

## 2023-03-20 SURGERY — EXTERNAL FIXATION, LOWER EXTREMITY
Anesthesia: General | Laterality: Left

## 2023-03-20 MED ORDER — OXYCODONE HCL 5 MG/5ML PO SOLN: 5.0000 mg | Freq: Once | ORAL | Status: DC | PRN

## 2023-03-20 MED ORDER — MENTHOL 3 MG MT LOZG: 1.0000 | LOZENGE | OROMUCOSAL | Status: DC | PRN

## 2023-03-20 MED ORDER — MIDAZOLAM HCL 2 MG/2ML IJ SOLN
INTRAMUSCULAR | Status: AC
  Filled 2023-03-20: qty 2

## 2023-03-20 MED ORDER — ROSUVASTATIN CALCIUM 5 MG PO TABS
5.0000 mg | ORAL_TABLET | Freq: Every day | ORAL | Status: DC
  Filled 2023-03-20: qty 1

## 2023-03-20 MED ORDER — METOCLOPRAMIDE HCL 5 MG PO TABS: 5.0000 mg | ORAL_TABLET | Freq: Three times a day (TID) | ORAL | Status: DC | PRN

## 2023-03-20 MED ORDER — CEFAZOLIN SODIUM-DEXTROSE 2-4 GM/100ML-% IV SOLN
2.0000 g | Freq: Four times a day (QID) | INTRAVENOUS | Status: AC
  Administered 2023-03-20 – 2023-03-21 (×2): 2 g via INTRAVENOUS
  Filled 2023-03-20 (×2): qty 100

## 2023-03-20 MED ORDER — ONDANSETRON HCL 4 MG/2ML IJ SOLN
4.0000 mg | Freq: Four times a day (QID) | INTRAMUSCULAR | Status: DC | PRN
  Administered 2023-03-21: 4 mg via INTRAVENOUS
  Filled 2023-03-20 (×2): qty 2

## 2023-03-20 MED ORDER — SUCCINYLCHOLINE CHLORIDE 200 MG/10ML IV SOSY
PREFILLED_SYRINGE | INTRAVENOUS | Status: AC
  Filled 2023-03-20: qty 10

## 2023-03-20 MED ORDER — AMISULPRIDE (ANTIEMETIC) 5 MG/2ML IV SOLN
10.0000 mg | Freq: Once | INTRAVENOUS | Status: AC | PRN
  Administered 2023-03-20: 10 mg via INTRAVENOUS

## 2023-03-20 MED ORDER — PHENYLEPHRINE 80 MCG/ML (10ML) SYRINGE FOR IV PUSH (FOR BLOOD PRESSURE SUPPORT)
PREFILLED_SYRINGE | INTRAVENOUS | Status: DC | PRN
  Administered 2023-03-20 (×2): 80 ug via INTRAVENOUS

## 2023-03-20 MED ORDER — FENTANYL CITRATE (PF) 250 MCG/5ML IJ SOLN
INTRAMUSCULAR | Status: AC
  Filled 2023-03-20: qty 5

## 2023-03-20 MED ORDER — ONDANSETRON HCL 4 MG PO TABS
4.0000 mg | ORAL_TABLET | Freq: Four times a day (QID) | ORAL | Status: DC | PRN
  Administered 2023-03-21 – 2023-03-26 (×4): 4 mg via ORAL
  Filled 2023-03-20 (×5): qty 1

## 2023-03-20 MED ORDER — ROCURONIUM BROMIDE 10 MG/ML (PF) SYRINGE
PREFILLED_SYRINGE | INTRAVENOUS | Status: DC | PRN
  Administered 2023-03-20 (×3): 20 mg via INTRAVENOUS
  Administered 2023-03-20: 70 mg via INTRAVENOUS

## 2023-03-20 MED ORDER — HYDROMORPHONE HCL 1 MG/ML IJ SOLN
INTRAMUSCULAR | Status: AC
  Filled 2023-03-20: qty 0.5

## 2023-03-20 MED ORDER — PROPOFOL 10 MG/ML IV BOLUS
INTRAVENOUS | Status: DC | PRN
  Administered 2023-03-20: 90 mg via INTRAVENOUS

## 2023-03-20 MED ORDER — PHENOL 1.4 % MT LIQD: 1.0000 | OROMUCOSAL | Status: DC | PRN

## 2023-03-20 MED ORDER — DIPHENHYDRAMINE HCL 25 MG PO CAPS: 50.0000 mg | ORAL_CAPSULE | Freq: Every evening | ORAL | Status: DC | PRN

## 2023-03-20 MED ORDER — LACTATED RINGERS IV SOLN
INTRAVENOUS | Status: DC | PRN
Start: 2023-03-20 — End: 2023-03-20

## 2023-03-20 MED ORDER — DEXAMETHASONE SODIUM PHOSPHATE 10 MG/ML IJ SOLN
INTRAMUSCULAR | Status: AC
  Filled 2023-03-20: qty 1

## 2023-03-20 MED ORDER — DEXTROSE 5 % IV SOLN
INTRAVENOUS | Status: DC | PRN
  Administered 2023-03-20: 2 g via INTRAVENOUS

## 2023-03-20 MED ORDER — LIDOCAINE 2% (20 MG/ML) 5 ML SYRINGE
INTRAMUSCULAR | Status: DC | PRN
  Administered 2023-03-20: 100 mg via INTRAVENOUS

## 2023-03-20 MED ORDER — LACTATED RINGERS IV SOLN: INTRAVENOUS | Status: DC

## 2023-03-20 MED ORDER — ALBUTEROL SULFATE (2.5 MG/3ML) 0.083% IN NEBU: 2.5000 mg | INHALATION_SOLUTION | Freq: Four times a day (QID) | RESPIRATORY_TRACT | Status: DC | PRN

## 2023-03-20 MED ORDER — 0.9 % SODIUM CHLORIDE (POUR BTL) OPTIME
TOPICAL | Status: DC | PRN
  Administered 2023-03-20: 1000 mL

## 2023-03-20 MED ORDER — HYDROMORPHONE HCL 1 MG/ML IJ SOLN: 0.5000 mg | INTRAMUSCULAR | Status: DC | PRN

## 2023-03-20 MED ORDER — OXYCODONE HCL 5 MG PO TABS
10.0000 mg | ORAL_TABLET | ORAL | Status: DC | PRN
  Administered 2023-03-22: 15 mg via ORAL
  Administered 2023-03-22: 10 mg via ORAL
  Administered 2023-03-23 (×2): 15 mg via ORAL
  Administered 2023-03-24: 10 mg via ORAL
  Administered 2023-03-24 – 2023-03-25 (×2): 15 mg via ORAL
  Administered 2023-03-25 (×2): 10 mg via ORAL
  Administered 2023-03-26 (×2): 15 mg via ORAL
  Filled 2023-03-20 (×3): qty 2
  Filled 2023-03-20 (×7): qty 3
  Filled 2023-03-20 (×2): qty 2

## 2023-03-20 MED ORDER — TRANEXAMIC ACID-NACL 1000-0.7 MG/100ML-% IV SOLN
1000.0000 mg | Freq: Once | INTRAVENOUS | Status: AC
  Administered 2023-03-21: 1000 mg via INTRAVENOUS
  Filled 2023-03-20: qty 100

## 2023-03-20 MED ORDER — DEXAMETHASONE SODIUM PHOSPHATE 10 MG/ML IJ SOLN
INTRAMUSCULAR | Status: DC | PRN
  Administered 2023-03-20: 10 mg via INTRAVENOUS

## 2023-03-20 MED ORDER — ENOXAPARIN SODIUM 40 MG/0.4ML IJ SOSY
40.0000 mg | PREFILLED_SYRINGE | INTRAMUSCULAR | Status: DC
  Administered 2023-03-21 – 2023-03-30 (×9): 40 mg via SUBCUTANEOUS
  Filled 2023-03-20 (×9): qty 0.4

## 2023-03-20 MED ORDER — HYDROMORPHONE HCL 1 MG/ML IJ SOLN: 0.2500 mg | INTRAMUSCULAR | Status: DC | PRN

## 2023-03-20 MED ORDER — SODIUM CHLORIDE 0.9 % IV SOLN
INTRAVENOUS | Status: DC | PRN
Start: 2023-03-20 — End: 2023-03-20

## 2023-03-20 MED ORDER — CHLORHEXIDINE GLUCONATE 0.12 % MT SOLN
OROMUCOSAL | Status: AC
  Administered 2023-03-20: 15 mL via OROMUCOSAL
  Filled 2023-03-20: qty 15

## 2023-03-20 MED ORDER — AMISULPRIDE (ANTIEMETIC) 5 MG/2ML IV SOLN
INTRAVENOUS | Status: AC
  Filled 2023-03-20: qty 4

## 2023-03-20 MED ORDER — PROPOFOL 10 MG/ML IV BOLUS
INTRAVENOUS | Status: AC
  Filled 2023-03-20: qty 20

## 2023-03-20 MED ORDER — CEFAZOLIN SODIUM-DEXTROSE 2-4 GM/100ML-% IV SOLN
INTRAVENOUS | Status: AC
  Filled 2023-03-20: qty 100

## 2023-03-20 MED ORDER — ACETAMINOPHEN 10 MG/ML IV SOLN
INTRAVENOUS | Status: AC
  Filled 2023-03-20: qty 100

## 2023-03-20 MED ORDER — LIDOCAINE 2% (20 MG/ML) 5 ML SYRINGE
INTRAMUSCULAR | Status: AC
  Filled 2023-03-20: qty 5

## 2023-03-20 MED ORDER — METOCLOPRAMIDE HCL 5 MG/ML IJ SOLN
5.0000 mg | Freq: Three times a day (TID) | INTRAMUSCULAR | Status: DC | PRN
  Administered 2023-03-21: 10 mg via INTRAVENOUS
  Filled 2023-03-20: qty 2

## 2023-03-20 MED ORDER — ALBUTEROL SULFATE HFA 108 (90 BASE) MCG/ACT IN AERS: 1.0000 | INHALATION_SPRAY | Freq: Four times a day (QID) | RESPIRATORY_TRACT | Status: DC | PRN

## 2023-03-20 MED ORDER — METHOCARBAMOL 1000 MG/10ML IJ SOLN
500.0000 mg | Freq: Three times a day (TID) | INTRAVENOUS | Status: DC
  Administered 2023-03-21 (×2): 500 mg via INTRAVENOUS
  Filled 2023-03-20: qty 5
  Filled 2023-03-20: qty 500
  Filled 2023-03-20: qty 5
  Filled 2023-03-20: qty 500

## 2023-03-20 MED ORDER — ONDANSETRON HCL 4 MG/2ML IJ SOLN
INTRAMUSCULAR | Status: AC
  Filled 2023-03-20: qty 2

## 2023-03-20 MED ORDER — OXYCODONE HCL 5 MG PO TABS: 5.0000 mg | ORAL_TABLET | Freq: Once | ORAL | Status: DC | PRN

## 2023-03-20 MED ORDER — ONDANSETRON HCL 4 MG PO TABS: 4.0000 mg | ORAL_TABLET | Freq: Four times a day (QID) | ORAL | Status: DC | PRN

## 2023-03-20 MED ORDER — MIDAZOLAM HCL 2 MG/2ML IJ SOLN
INTRAMUSCULAR | Status: DC | PRN
  Administered 2023-03-20: 2 mg via INTRAVENOUS

## 2023-03-20 MED ORDER — PHENYLEPHRINE HCL-NACL 20-0.9 MG/250ML-% IV SOLN
INTRAVENOUS | Status: DC | PRN
  Administered 2023-03-20: 30 ug/min via INTRAVENOUS

## 2023-03-20 MED ORDER — METOCLOPRAMIDE HCL 5 MG/ML IJ SOLN: 5.0000 mg | Freq: Three times a day (TID) | INTRAMUSCULAR | Status: DC | PRN

## 2023-03-20 MED ORDER — DOCUSATE SODIUM 100 MG PO CAPS: 100.0000 mg | ORAL_CAPSULE | Freq: Two times a day (BID) | ORAL | Status: DC

## 2023-03-20 MED ORDER — HYDROMORPHONE HCL 1 MG/ML IJ SOLN
INTRAMUSCULAR | Status: AC
  Filled 2023-03-20: qty 1

## 2023-03-20 MED ORDER — ACETAMINOPHEN 500 MG PO TABS
1000.0000 mg | ORAL_TABLET | Freq: Four times a day (QID) | ORAL | Status: DC
  Administered 2023-03-21 – 2023-03-28 (×24): 1000 mg via ORAL
  Filled 2023-03-20 (×27): qty 2

## 2023-03-20 MED ORDER — ACETAMINOPHEN 325 MG PO TABS: 325.0000 mg | ORAL_TABLET | Freq: Four times a day (QID) | ORAL | Status: DC | PRN

## 2023-03-20 MED ORDER — ONDANSETRON HCL 4 MG/2ML IJ SOLN
INTRAMUSCULAR | Status: DC | PRN
  Administered 2023-03-20: 4 mg via INTRAVENOUS

## 2023-03-20 MED ORDER — CHLORHEXIDINE GLUCONATE 0.12 % MT SOLN: 15.0000 mL | Freq: Once | OROMUCOSAL | Status: AC

## 2023-03-20 MED ORDER — DOCUSATE SODIUM 100 MG PO CAPS
100.0000 mg | ORAL_CAPSULE | Freq: Two times a day (BID) | ORAL | Status: DC
  Administered 2023-03-20 – 2023-03-26 (×13): 100 mg via ORAL
  Filled 2023-03-20 (×13): qty 1

## 2023-03-20 MED ORDER — ROCURONIUM BROMIDE 10 MG/ML (PF) SYRINGE
PREFILLED_SYRINGE | INTRAVENOUS | Status: AC
  Filled 2023-03-20: qty 10

## 2023-03-20 MED ORDER — ACETAMINOPHEN 10 MG/ML IV SOLN
1000.0000 mg | Freq: Once | INTRAVENOUS | Status: DC | PRN
  Administered 2023-03-20: 1000 mg via INTRAVENOUS

## 2023-03-20 MED ORDER — FENTANYL CITRATE (PF) 250 MCG/5ML IJ SOLN
INTRAMUSCULAR | Status: DC | PRN
  Administered 2023-03-20 (×3): 50 ug via INTRAVENOUS
  Administered 2023-03-20: 100 ug via INTRAVENOUS

## 2023-03-20 MED ORDER — SUGAMMADEX SODIUM 200 MG/2ML IV SOLN
INTRAVENOUS | Status: DC | PRN
  Administered 2023-03-20: 300 mg via INTRAVENOUS

## 2023-03-20 MED ORDER — ONDANSETRON HCL 4 MG/2ML IJ SOLN: 4.0000 mg | Freq: Four times a day (QID) | INTRAMUSCULAR | Status: DC | PRN

## 2023-03-20 MED ORDER — OXYCODONE HCL 5 MG PO TABS
5.0000 mg | ORAL_TABLET | ORAL | Status: DC | PRN
  Administered 2023-03-21 – 2023-03-22 (×3): 5 mg via ORAL
  Filled 2023-03-20 (×3): qty 1

## 2023-03-20 MED ORDER — ORAL CARE MOUTH RINSE: 15.0000 mL | Freq: Once | OROMUCOSAL | Status: AC

## 2023-03-20 SURGICAL SUPPLY — 107 items
BAG COUNTER SPONGE SURGICOUNT (BAG) ×1 IMPLANT
BAG SPNG CNTER NS LX DISP (BAG) ×1
BANDAGE ESMARK 6X9 LF (GAUZE/BANDAGES/DRESSINGS) ×1 IMPLANT
BAR EXFX 500X11 NS LF (EXFIX) ×2
BAR GLASS FIBER EXFX 11X500 (EXFIX) IMPLANT
BIT DRILL 2.0 (BIT) ×1
BIT DRILL 2XNS DISP SS SM FRAG (BIT) IMPLANT
BIT DRILL 3.2 (BIT) ×1
BIT DRILL 3.2XCALB NS DISP (BIT) IMPLANT
BIT DRILL CALIBRATED 2.7 (BIT) IMPLANT
BIT DRL 2XNS DISP SS SM FRAG (BIT) ×1
BIT DRL 3.2XCALB NS DISP (BIT) ×1
BNDG CMPR 5X4 KNIT ELC UNQ LF (GAUZE/BANDAGES/DRESSINGS) ×2
BNDG CMPR 5X6 CHSV STRCH STRL (GAUZE/BANDAGES/DRESSINGS)
BNDG CMPR 6 X 5 YARDS HK CLSR (GAUZE/BANDAGES/DRESSINGS) ×1
BNDG CMPR 9X6 STRL LF SNTH (GAUZE/BANDAGES/DRESSINGS)
BNDG COHESIVE 6X5 TAN ST LF (GAUZE/BANDAGES/DRESSINGS) IMPLANT
BNDG ELASTIC 4INX 5YD STR LF (GAUZE/BANDAGES/DRESSINGS) IMPLANT
BNDG ELASTIC 4X5.8 VLCR STR LF (GAUZE/BANDAGES/DRESSINGS) ×1 IMPLANT
BNDG ELASTIC 6INX 5YD STR LF (GAUZE/BANDAGES/DRESSINGS) IMPLANT
BNDG ELASTIC 6X5.8 VLCR STR LF (GAUZE/BANDAGES/DRESSINGS) ×1 IMPLANT
BNDG ESMARK 6X9 LF (GAUZE/BANDAGES/DRESSINGS)
BNDG GAUZE DERMACEA FLUFF 4 (GAUZE/BANDAGES/DRESSINGS) ×2 IMPLANT
BNDG GZE DERMACEA 4 6PLY (GAUZE/BANDAGES/DRESSINGS) ×1
BONE CANC CHIPS 20CC PCAN1/4 (Bone Implant) ×1 IMPLANT
BRUSH SCRUB EZ PLAIN DRY (MISCELLANEOUS) ×2 IMPLANT
CHIPS CANC BONE 20CC PCAN1/4 (Bone Implant) ×1 IMPLANT
COVER SURGICAL LIGHT HANDLE (MISCELLANEOUS) ×2 IMPLANT
CUFF TOURN SGL QUICK 18X4 (TOURNIQUET CUFF) IMPLANT
DRAPE C-ARM 42X72 X-RAY (DRAPES) IMPLANT
DRAPE C-ARMOR (DRAPES) ×1 IMPLANT
DRAPE CAMERA CLOSED 9X96 (DRAPES) IMPLANT
DRAPE HALF SHEET 40X57 (DRAPES) IMPLANT
DRAPE INCISE IOBAN 66X45 STRL (DRAPES) IMPLANT
DRAPE ORTHO SPLIT 77X108 STRL (DRAPES) ×2
DRAPE SURG ORHT 6 SPLT 77X108 (DRAPES) IMPLANT
DRAPE U-SHAPE 47X51 STRL (DRAPES) ×1 IMPLANT
DRSG ADAPTIC 3X8 NADH LF (GAUZE/BANDAGES/DRESSINGS) ×1 IMPLANT
DRSG MEPILEX POST OP 4X8 (GAUZE/BANDAGES/DRESSINGS) IMPLANT
DRSG MEPITEL 4X7.2 (GAUZE/BANDAGES/DRESSINGS) IMPLANT
ELECT REM PT RETURN 9FT ADLT (ELECTROSURGICAL) ×1
ELECTRODE REM PT RTRN 9FT ADLT (ELECTROSURGICAL) ×1 IMPLANT
EVACUATOR 1/8 PVC DRAIN (DRAIN) IMPLANT
GAUZE SPONGE 4X4 12PLY STRL (GAUZE/BANDAGES/DRESSINGS) ×1 IMPLANT
GLOVE BIO SURGEON STRL SZ 6 (GLOVE) IMPLANT
GLOVE BIO SURGEON STRL SZ7 (GLOVE) IMPLANT
GLOVE BIO SURGEON STRL SZ7.5 (GLOVE) ×1 IMPLANT
GLOVE BIO SURGEON STRL SZ8 (GLOVE) ×1 IMPLANT
GLOVE BIOGEL PI IND STRL 6.5 (GLOVE) IMPLANT
GLOVE BIOGEL PI IND STRL 7.0 (GLOVE) IMPLANT
GLOVE BIOGEL PI IND STRL 7.5 (GLOVE) ×1 IMPLANT
GLOVE BIOGEL PI IND STRL 8 (GLOVE) ×1 IMPLANT
GLOVE SS BIOGEL STRL SZ 6.5 (GLOVE) IMPLANT
GLOVE SURG ORTHO LTX SZ7.5 (GLOVE) ×2 IMPLANT
GOWN STRL REUS W/ TWL LRG LVL3 (GOWN DISPOSABLE) ×2 IMPLANT
GOWN STRL REUS W/ TWL XL LVL3 (GOWN DISPOSABLE) ×1 IMPLANT
GOWN STRL REUS W/TWL LRG LVL3 (GOWN DISPOSABLE) ×2
GOWN STRL REUS W/TWL XL LVL3 (GOWN DISPOSABLE) ×4
GOWN STRL SURGICAL XL XLNG (GOWN DISPOSABLE) IMPLANT
GRAFT BNE CANC CHIPS 1-8 20CC (Bone Implant) IMPLANT
HALF PIN 5.0X160 (EXFIX) IMPLANT
K-WIRE 2X5 SS THRDED S3 (WIRE) ×4
KIT BASIN OR (CUSTOM PROCEDURE TRAY) ×1 IMPLANT
KIT TURNOVER KIT B (KITS) ×1 IMPLANT
KWIRE 2X5 SS THRDED S3 (WIRE) IMPLANT
MANIFOLD NEPTUNE II (INSTRUMENTS) ×1 IMPLANT
NDL 1/2 CIR CATGUT .05X1.09 (NEEDLE) IMPLANT
NDL 18GX1X1/2 (RX/OR ONLY) (NEEDLE) IMPLANT
NDL 22X1.5 STRL (OR ONLY) (MISCELLANEOUS) IMPLANT
NEEDLE 1/2 CIR CATGUT .05X1.09 (NEEDLE) ×1 IMPLANT
NEEDLE 18GX1X1/2 (RX/OR ONLY) (NEEDLE) ×1 IMPLANT
NEEDLE 22X1.5 STRL (OR ONLY) (MISCELLANEOUS) IMPLANT
NS IRRIG 1000ML POUR BTL (IV SOLUTION) ×1 IMPLANT
PACK ORTHO EXTREMITY (CUSTOM PROCEDURE TRAY) ×1 IMPLANT
PAD ARMBOARD 7.5X6 YLW CONV (MISCELLANEOUS) ×2 IMPLANT
PAD CAST 4YDX4 CTTN HI CHSV (CAST SUPPLIES) IMPLANT
PADDING CAST COTTON 4X4 STRL (CAST SUPPLIES) ×1
PADDING CAST COTTON 6X4 STRL (CAST SUPPLIES) ×2 IMPLANT
PEG LOCKING 3.2X36 (Screw) IMPLANT
PEG LOCKING 3.2X40 (Peg) IMPLANT
PENCIL BUTTON HOLSTER BLD 10FT (ELECTRODE) IMPLANT
PIN CLAMP 2BAR 75MM BLUE (EXFIX) IMPLANT
PIN HALF YELLOW 5X160X35 (EXFIX) IMPLANT
PLATE PROX HUMERUS HI LT 4H (Plate) IMPLANT
SCREW LP NL T15 3.5X24 (Screw) IMPLANT
SCREW LP NL T15 3.5X26 (Screw) IMPLANT
SCREW T15 LP CORT 3.5X42MM NS (Screw) IMPLANT
SCREW T15 LP CORT 3.5X54 NS (Screw) IMPLANT
SPONGE T-LAP 18X18 ~~LOC~~+RFID (SPONGE) ×1 IMPLANT
STAPLER VISISTAT 35W (STAPLE) IMPLANT
STOCKINETTE IMPERVIOUS LG (DRAPES) IMPLANT
STRIP CLOSURE SKIN 1/2X4 (GAUZE/BANDAGES/DRESSINGS) IMPLANT
SUT ETHILON 2 0 PSLX (SUTURE) IMPLANT
SUT MAXBRAID (SUTURE) IMPLANT
SUT MAXBRAID #2 CVD NDL (SUTURE) IMPLANT
SUT VIC AB 0 CT1 27 (SUTURE) ×1
SUT VIC AB 0 CT1 27XBRD ANBCTR (SUTURE) IMPLANT
SUT VIC AB 1 CT1 27 (SUTURE) ×1
SUT VIC AB 1 CT1 27XBRD ANBCTR (SUTURE) IMPLANT
SUT VIC AB 2-0 CT1 (SUTURE) IMPLANT
SUT VIC AB 2-0 CT1 36 (SUTURE) IMPLANT
SYR 20ML LL LF (SYRINGE) IMPLANT
SYR 5ML LUER SLIP (SYRINGE) IMPLANT
TOWEL GREEN STERILE (TOWEL DISPOSABLE) ×2 IMPLANT
TOWEL GREEN STERILE FF (TOWEL DISPOSABLE) ×2 IMPLANT
TRAY FOLEY MTR SLVR 16FR STAT (SET/KITS/TRAYS/PACK) IMPLANT
UNDERPAD 30X36 HEAVY ABSORB (UNDERPADS AND DIAPERS) ×1 IMPLANT
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
## 2023-03-20 NOTE — Progress Notes (Signed)
Orthopedic Tech Progress Note Patient Details:  Harry Morales 07-Mar-1971 [id number]  Ortho Devices Type of Ortho Device: Post (long leg) splint Ortho Device/Splint Location: lle Ortho Device/Splint Interventions: Ordered, Application, Adjustment  I had the patients tech hold the leg for me. I applied the splint in a position of comfort. The patient had his foot turned in so I splinted it that way. Post Interventions Patient Tolerated: Well Instructions Provided: Care of device, Adjustment of device  Trinna Post 03/20/2023, 12:25 AM

## 2023-03-20 NOTE — ED Notes (Signed)
[facility] Link at bedside 

## 2023-03-20 NOTE — Anesthesia Preprocedure Evaluation (Addendum)
Anesthesia Evaluation  Patient identified by MRN, date of birth, ID band Patient awake    Reviewed: Allergy & Precautions, NPO status , Patient's Chart, lab work & pertinent test results  History of Anesthesia Complications Negative for: history of anesthetic complications  Airway Mallampati: II  TM Distance: >3 FB Neck ROM: Full   Comment: Previous grade I view with MAC 3, easy mask Dental  (+) Dental Advisory Given, Edentulous Upper 6 broken teeth on the bottom:   Pulmonary neg pulmonary ROS   Pulmonary exam normal breath sounds clear to auscultation       Cardiovascular hypertension, (-) angina (-) Past MI, (-) Cardiac Stents and (-) CABG (-) dysrhythmias  Rhythm:Regular Rate:Normal  HLD   Neuro/Psych  Headaches, neg Seizures    GI/Hepatic Neg liver ROS,GERD  ,,H/o pancreatitis 2020   Endo/Other  negative endocrine ROS    Renal/GU negative Renal ROS     Musculoskeletal  (+) Arthritis ,    Abdominal   Peds  Hematology negative hematology ROS (+)   Anesthesia Other Findings   Reproductive/Obstetrics                             Anesthesia Physical Anesthesia Plan  ASA: 2  Anesthesia Plan: General   Post-op Pain Management:    Induction: Intravenous  PONV Risk Score and Plan: 2 and Ondansetron, Dexamethasone and Treatment may vary due to age or medical condition  Airway Management Planned: Oral ETT  Additional Equipment:   Intra-op Plan:   Post-operative Plan: Extubation in OR  Informed Consent: I have reviewed the patients History and Physical, chart, labs and discussed the procedure including the risks, benefits and alternatives for the proposed anesthesia with the patient or authorized representative who has indicated his/her understanding and acceptance.     Dental advisory given  Plan Discussed with: CRNA and Anesthesiologist  Anesthesia Plan Comments: (Risks of  general anesthesia discussed including, but not limited to, sore throat, hoarse voice, chipped/damaged teeth, injury to vocal cords, nausea and vomiting, allergic reactions, lung infection, heart attack, stroke, and death. All questions answered. )       Anesthesia Quick Evaluation

## 2023-03-20 NOTE — Anesthesia Procedure Notes (Addendum)
Procedure Name: Intubation Date/Time: 03/20/2023 2:27 PM  Performed by: Pincus Large, CRNAPre-anesthesia Checklist: Patient identified, Emergency Drugs available, Suction available and Patient being monitored Patient Re-evaluated:Patient Re-evaluated prior to induction Oxygen Delivery Method: Circle System Utilized Preoxygenation: Pre-oxygenation with 100% oxygen Induction Type: IV induction Ventilation: Mask ventilation without difficulty Laryngoscope Size: Mac and 3 Grade View: Grade II Tube type: Oral Tube size: 7.5 mm Number of attempts: 1 Airway Equipment and Method: Stylet and Oral airway Placement Confirmation: ETT inserted through vocal cords under direct vision, positive ETCO2 and breath sounds checked- equal and bilateral Secured at: 22 cm Tube secured with: Tape Dental Injury: Teeth and Oropharynx as per pre-operative assessment

## 2023-03-20 NOTE — Transfer of Care (Addendum)
Immediate Anesthesia Transfer of Care Note  Patient: Harry Morales  Procedure(s) Performed: EXTERNAL FIXATION LEFT KNEE (Left) OPEN REDUCTION INTERNAL FIXATION (ORIF) PROXIMAL HUMERUS FRACTURE (Left)  Patient Location: PACU  Anesthesia Type:General  Level of Consciousness: sedated and drowsy  Airway & Oxygen Therapy: Patient Spontanous Breathing and Patient connected to face mask oxygen  Post-op Assessment: Report given to RN and Post -op Vital signs reviewed and stable  Post vital signs: Reviewed and stable  Last Vitals:  Vitals Value Taken Time  BP 118/77 03/20/23 1824  Temp 97.6   Pulse 75   Resp 10 03/20/23 1827  SpO2 98 %   Vitals shown include unfiled device data.  Last Pain:  Vitals:   03/20/23 1020  TempSrc:   PainSc: 0-No pain         Complications: No notable events documented.

## 2023-03-20 NOTE — Plan of Care (Signed)
  Problem: Education: Goal: Knowledge of General Education information will improve Description: Including pain rating scale, medication(s)/side effects and non-pharmacologic comfort measures Outcome: Progressing   Problem: Health Behavior/Discharge Planning: Goal: Ability to manage health-related needs will improve Outcome: Progressing   Problem: Clinical Measurements: Goal: Ability to maintain clinical measurements within normal limits will improve Outcome: Progressing Goal: Will remain free from infection Outcome: Progressing Goal: Diagnostic test results will improve Outcome: Progressing Goal: Respiratory complications will improve Outcome: Progressing Goal: Cardiovascular complication will be avoided Outcome: Progressing   Problem: Activity: Goal: Risk for activity intolerance will decrease Outcome: Progressing   Problem: Nutrition: Goal: Adequate nutrition will be maintained Outcome: Progressing   Problem: Coping: Goal: Level of anxiety will decrease Outcome: Progressing   Problem: Elimination: Goal: Will not experience complications related to bowel motility Outcome: Progressing Goal: Will not experience complications related to urinary retention Outcome: Progressing   Problem: Pain Managment: Goal: General experience of comfort will improve Outcome: Progressing   Problem: Safety: Goal: Ability to remain free from injury will improve Outcome: Progressing   Problem: Skin Integrity: Goal: Risk for impaired skin integrity will decrease Outcome: Progressing   Problem: Education: Goal: Verbalization of understanding the information provided (i.e., activity precautions, restrictions, etc) will improve Outcome: Progressing Goal: Individualized Educational Video(s) Outcome: Progressing   Problem: Activity: Goal: Ability to ambulate and perform ADLs will improve Outcome: Progressing   Problem: Clinical Measurements: Goal: Postoperative complications will be  avoided or minimized Outcome: Progressing   Problem: Self-Concept: Goal: Ability to maintain and perform role responsibilities to the fullest extent possible will improve Outcome: Progressing   Problem: Pain Management: Goal: Pain level will decrease Outcome: Progressing   Problem: Education: Goal: Knowledge of the prescribed therapeutic regimen will improve Outcome: Progressing Goal: Understanding of activity limitations/precautions following surgery will improve Outcome: Progressing Goal: Individualized Educational Video(s) Outcome: Progressing   Problem: Activity: Goal: Ability to tolerate increased activity will improve Outcome: Progressing   Problem: Pain Management: Goal: Pain level will decrease with appropriate interventions Outcome: Progressing

## 2023-03-21 ENCOUNTER — Inpatient Hospital Stay (HOSPITAL_COMMUNITY): Payer: 59

## 2023-03-21 LAB — BASIC METABOLIC PANEL
Anion gap: 14 (ref 5–15)
BUN: 10 mg/dL (ref 6–20)
CO2: 23 mmol/L (ref 22–32)
Calcium: 8.5 mg/dL — ABNORMAL LOW (ref 8.9–10.3)
Chloride: 100 mmol/L (ref 98–111)
Creatinine, Ser: 1.08 mg/dL (ref 0.61–1.24)
GFR, Estimated: >60 mL/min (ref >60)
Glucose, Bld: 164 mg/dL — ABNORMAL HIGH (ref 70–99)
Potassium: 3.6 mmol/L (ref 3.5–5.1)
Sodium: 137 mmol/L (ref 135–145)

## 2023-03-21 LAB — CBC
HCT: 35.3 % — ABNORMAL LOW (ref 39.0–52.0)
Hemoglobin: 12.1 g/dL — ABNORMAL LOW (ref 13.0–17.0)
MCH: 29.8 pg (ref 26.0–34.0)
MCHC: 34.3 g/dL (ref 30.0–36.0)
MCV: 86.9 fL (ref 80.0–100.0)
Platelets: 211 10*3/uL (ref 150–400)
RBC: 4.06 MIL/uL — ABNORMAL LOW (ref 4.22–5.81)
RDW: 13.1 % (ref 11.5–15.5)
WBC: 13.6 10*3/uL — ABNORMAL HIGH (ref 4.0–10.5)
nRBC: 0 % (ref 0.0–0.2)

## 2023-03-21 LAB — VITAMIN D 25 HYDROXY (VIT D DEFICIENCY, FRACTURES): Vit D, 25-Hydroxy: 31.64 ng/mL (ref 30–100)

## 2023-03-21 MED ORDER — METHOCARBAMOL 500 MG PO TABS
1000.0000 mg | ORAL_TABLET | Freq: Three times a day (TID) | ORAL | Status: DC
  Administered 2023-03-21 – 2023-03-30 (×26): 1000 mg via ORAL
  Filled 2023-03-21 (×26): qty 2

## 2023-03-21 NOTE — Plan of Care (Signed)
Problem: Education: Goal: Knowledge of General Education information will improve Description: Including pain rating scale, medication(s)/side effects and non-pharmacologic comfort measures Outcome: Progressing   Problem: Health Behavior/Discharge Planning: Goal: Ability to manage health-related needs will improve Outcome: Progressing   Problem: Clinical Measurements: Goal: Ability to maintain clinical measurements within normal limits will improve Outcome: Progressing Goal: Will remain free from infection Outcome: Progressing Goal: Diagnostic test results will improve Outcome: Progressing Goal: Respiratory complications will improve Outcome: Progressing Goal: Cardiovascular complication will be avoided Outcome: Progressing   Problem: Activity: Goal: Risk for activity intolerance will decrease Outcome: Progressing   Problem: Nutrition: Goal: Adequate nutrition will be maintained Outcome: Progressing   Problem: Coping: Goal: Level of anxiety will decrease Outcome: Progressing   Problem: Elimination: Goal: Will not experience complications related to bowel motility Outcome: Progressing Goal: Will not experience complications related to urinary retention Outcome: Progressing   Problem: Pain Managment: Goal: General experience of comfort will improve Outcome: Progressing   Problem: Safety: Goal: Ability to remain free from injury will improve Outcome: Progressing   Problem: Skin Integrity: Goal: Risk for impaired skin integrity will decrease Outcome: Progressing   Problem: Education: Goal: Verbalization of understanding the information provided (i.e., activity precautions, restrictions, etc) will improve Outcome: Progressing Goal: Individualized Educational Video(s) Outcome: Progressing   Problem: Activity: Goal: Ability to ambulate and perform ADLs will improve Outcome: Progressing   Problem: Clinical Measurements: Goal: Postoperative complications will be  avoided or minimized Outcome: Progressing   Problem: Self-Concept: Goal: Ability to maintain and perform role responsibilities to the fullest extent possible will improve Outcome: Progressing   Problem: Pain Management: Goal: Pain level will decrease Outcome: Progressing   Problem: Education: Goal: Knowledge of the prescribed therapeutic regimen will improve Outcome: Progressing Goal: Understanding of activity limitations/precautions following surgery will improve Outcome: Progressing Goal: Individualized Educational Video(s) Outcome: Progressing   Problem: Activity: Goal: Ability to tolerate increased activity will improve Outcome: Progressing   Problem: Pain Management: Goal: Pain level will decrease with appropriate interventions Outcome: Progressing   Problem: Education: Goal: Knowledge of General Education information will improve Description: Including pain rating scale, medication(s)/side effects and non-pharmacologic comfort measures Outcome: Progressing   Problem: Health Behavior/Discharge Planning: Goal: Ability to manage health-related needs will improve Outcome: Progressing   Problem: Clinical Measurements: Goal: Ability to maintain clinical measurements within normal limits will improve Outcome: Progressing Goal: Will remain free from infection Outcome: Progressing Goal: Diagnostic test results will improve Outcome: Progressing Goal: Respiratory complications will improve Outcome: Progressing Goal: Cardiovascular complication will be avoided Outcome: Progressing   Problem: Activity: Goal: Risk for activity intolerance will decrease Outcome: Progressing   Problem: Nutrition: Goal: Adequate nutrition will be maintained Outcome: Progressing   Problem: Coping: Goal: Level of anxiety will decrease Outcome: Progressing   Problem: Elimination: Goal: Will not experience complications related to bowel motility Outcome: Progressing Goal: Will not  experience complications related to urinary retention Outcome: Progressing   Problem: Pain Managment: Goal: General experience of comfort will improve Outcome: Progressing   Problem: Safety: Goal: Ability to remain free from injury will improve Outcome: Progressing   Problem: Skin Integrity: Goal: Risk for impaired skin integrity will decrease Outcome: Progressing   Problem: Education: Goal: Verbalization of understanding the information provided (i.e., activity precautions, restrictions, etc) will improve Outcome: Progressing Goal: Individualized Educational Video(s) Outcome: Progressing   Problem: Activity: Goal: Ability to ambulate and perform ADLs will improve Outcome: Progressing   Problem: Clinical Measurements: Goal: Postoperative complications will be avoided or minimized Outcome: Progressing  Problem: Self-Concept: Goal: Ability to maintain and perform role responsibilities to the fullest extent possible will improve Outcome: Progressing   Problem: Pain Management: Goal: Pain level will decrease Outcome: Progressing   Problem: Education: Goal: Knowledge of the prescribed therapeutic regimen will improve Outcome: Progressing Goal: Understanding of activity limitations/precautions following surgery will improve Outcome: Progressing Goal: Individualized Educational Video(s) Outcome: Progressing   Problem: Activity: Goal: Ability to tolerate increased activity will improve Outcome: Progressing   Problem: Pain Management: Goal: Pain level will decrease with appropriate interventions Outcome: Progressing

## 2023-03-21 NOTE — Anesthesia Postprocedure Evaluation (Signed)
Anesthesia Post Note  Patient: Harry Morales  Procedure(s) Performed: EXTERNAL FIXATION LEFT KNEE (Left) OPEN REDUCTION INTERNAL FIXATION (ORIF) PROXIMAL HUMERUS FRACTURE (Left)     Patient location during evaluation: PACU Anesthesia Type: General Level of consciousness: awake and alert Pain management: pain level controlled Vital Signs Assessment: post-procedure vital signs reviewed and stable Respiratory status: spontaneous breathing, nonlabored ventilation, respiratory function stable and patient connected to nasal cannula oxygen Cardiovascular status: blood pressure returned to baseline and stable Postop Assessment: no apparent nausea or vomiting Anesthetic complications: no   No notable events documented.  Last Vitals:  Vitals:   03/21/23 1502 03/21/23 1926  BP: 126/79 (!) 128/90  Pulse: (!) 106 (!) 101  Resp:  18  Temp: 36.6 C 37.2 C  SpO2: 96% 95%    Last Pain:  Vitals:   03/21/23 1926  TempSrc: Oral  PainSc:                  Myla Mauriello S
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
## 2023-03-22 LAB — BASIC METABOLIC PANEL
Anion gap: 10 (ref 5–15)
BUN: 11 mg/dL (ref 6–20)
CO2: 26 mmol/L (ref 22–32)
Calcium: 8.2 mg/dL — ABNORMAL LOW (ref 8.9–10.3)
Chloride: 102 mmol/L (ref 98–111)
Creatinine, Ser: 1.1 mg/dL (ref 0.61–1.24)
GFR, Estimated: >60 mL/min (ref >60)
Glucose, Bld: 113 mg/dL — ABNORMAL HIGH (ref 70–99)
Potassium: 3.6 mmol/L (ref 3.5–5.1)
Sodium: 138 mmol/L (ref 135–145)

## 2023-03-22 LAB — CBC
HCT: 34.6 % — ABNORMAL LOW (ref 39.0–52.0)
Hemoglobin: 11.6 g/dL — ABNORMAL LOW (ref 13.0–17.0)
MCH: 29.7 pg (ref 26.0–34.0)
MCHC: 33.5 g/dL (ref 30.0–36.0)
MCV: 88.7 fL (ref 80.0–100.0)
Platelets: 200 10*3/uL (ref 150–400)
RBC: 3.9 MIL/uL — ABNORMAL LOW (ref 4.22–5.81)
RDW: 13.2 % (ref 11.5–15.5)
WBC: 9.3 10*3/uL (ref 4.0–10.5)
nRBC: 0 % (ref 0.0–0.2)
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
## 2023-03-22 NOTE — TOC CAGE-AID Note (Signed)
Transition of Care [facility]) - CAGE-AID Screening   Patient Details  Name: Harry Morales MRN: [id number] Date of Birth: 06-Oct-1970  [facility]) CM/SW Contact:    Leota Sauers, RN Phone Number: 03/22/2023, 3:05 AM   Clinical Narrative:  Patient denies use of alcohol and illicit substances. Resources not given at this time.   CAGE-AID Screening:    Have You Ever Felt You Ought to Cut Down on Your Drinking or Drug Use?: No Have People Annoyed You By Critizing Your Drinking Or Drug Use?: No Have You Felt Bad Or Guilty About Your Drinking Or Drug Use?: No Have You Ever Had a Drink or Used Drugs First Thing In The Morning to Steady Your Nerves or to Get Rid of a Hangover?: No CAGE-AID Score: 0  Substance Abuse Education Offered: No

## 2023-03-22 NOTE — Plan of Care (Signed)
  Problem: Clinical Measurements: Goal: Will remain free from infection Outcome: Progressing   Problem: Activity: Goal: Risk for activity intolerance will decrease Outcome: Progressing   Problem: Nutrition: Goal: Adequate nutrition will be maintained Outcome: Progressing   Problem: Pain Managment: Goal: General experience of comfort will improve Outcome: Progressing   

## 2023-03-22 NOTE — Progress Notes (Signed)
Orthopaedic Trauma Service Progress Note  Patient ID: Harry Morales MRN: [id number] DOB/AGE: 08/12/1970 52 y.o.  Subjective:  Minimal pain  Doing well No complaints  Eager to have surgery   Sat in chair for about 45 min yesterday   ROS As above  Objective:   VITALS:   Vitals:   03/21/23 1502 03/21/23 1926 03/22/23 0449 03/22/23 0734  BP: 126/79 (!) 128/90 112/83 123/83  Pulse: (!) 106 (!) 101 95 94  Resp:  18 18   Temp: 97.8 F (36.6 C) 98.9 F (37.2 C) (!) 97.4 F (36.3 C) 98.1 F (36.7 C)  TempSrc: Oral Oral Oral Oral  SpO2: 96% 95% 94% 97%  Weight:      Height:        Estimated body mass index is 24.03 kg/m as calculated from the following:   Height as of this encounter: 5\' 4"  (1.626 m).   Weight as of this encounter: 63.5 kg.   Intake/Output      10/09 0701 10/10 0700 10/10 0701 10/11 0700   P.O. 120    I.V. (mL/kg)     IV Piggyback     Total Intake(mL/kg) 120 (1.9)    Urine (mL/kg/hr) 550 (0.4)    Blood     Total Output 550    Net -430           LABS  Results for orders placed or performed during the hospital encounter of 03/19/23 (from the past 24 hour(s))  CBC     Status: Abnormal   Collection Time: 03/22/23  6:23 AM  Result Value Ref Range   WBC 9.3 4.0 - 10.5 K/uL   RBC 3.90 (L) 4.22 - 5.81 MIL/uL   Hemoglobin 11.6 (L) 13.0 - 17.0 g/dL   HCT 40.1 (L) 02.7 - 25.3 %   MCV 88.7 80.0 - 100.0 fL   MCH 29.7 26.0 - 34.0 pg   MCHC 33.5 30.0 - 36.0 g/dL   RDW 66.4 40.3 - 47.4 %   Platelets 200 150 - 400 K/uL   nRBC 0.0 0.0 - 0.2 %  Basic metabolic panel     Status: Abnormal   Collection Time: 03/22/23  6:23 AM  Result Value Ref Range   Sodium 138 135 - 145 mmol/L   Potassium 3.6 3.5 - 5.1 mmol/L   Chloride 102 98 - 111 mmol/L   CO2 26 22 - 32 mmol/L   Glucose, Bld 113 (H) 70 - 99 mg/dL   BUN 11 6 - 20 mg/dL   Creatinine, Ser 2.59 0.61 - 1.24 mg/dL   Calcium  8.2 (L) 8.9 - 10.3 mg/dL   GFR, Estimated >56 >38 mL/min   Anion gap 10 5 - 15     PHYSICAL EXAM:   Gen: sitting up in bed, NAD, looks good Lungs: unlabored Cardiac: reg Abd: + BS, NTND Ext:       Left upper extremity              Dressing stable and clean              Ext warm              Moderate ecchymosis but stable             Swelling stable  Orthopaedic Trauma Service Progress Note  Patient ID: Harry Morales MRN: [id number] DOB/AGE: 08/12/1970 52 y.o.  Subjective:  Minimal pain  Doing well No complaints  Eager to have surgery   Sat in chair for about 45 min yesterday   ROS As above  Objective:   VITALS:   Vitals:   03/21/23 1502 03/21/23 1926 03/22/23 0449 03/22/23 0734  BP: 126/79 (!) 128/90 112/83 123/83  Pulse: (!) 106 (!) 101 95 94  Resp:  18 18   Temp: 97.8 F (36.6 C) 98.9 F (37.2 C) (!) 97.4 F (36.3 C) 98.1 F (36.7 C)  TempSrc: Oral Oral Oral Oral  SpO2: 96% 95% 94% 97%  Weight:      Height:        Estimated body mass index is 24.03 kg/m as calculated from the following:   Height as of this encounter: 5\' 4"  (1.626 m).   Weight as of this encounter: 63.5 kg.   Intake/Output      10/09 0701 10/10 0700 10/10 0701 10/11 0700   P.O. 120    I.V. (mL/kg)     IV Piggyback     Total Intake(mL/kg) 120 (1.9)    Urine (mL/kg/hr) 550 (0.4)    Blood     Total Output 550    Net -430           LABS  Results for orders placed or performed during the hospital encounter of 03/19/23 (from the past 24 hour(s))  CBC     Status: Abnormal   Collection Time: 03/22/23  6:23 AM  Result Value Ref Range   WBC 9.3 4.0 - 10.5 K/uL   RBC 3.90 (L) 4.22 - 5.81 MIL/uL   Hemoglobin 11.6 (L) 13.0 - 17.0 g/dL   HCT 40.1 (L) 02.7 - 25.3 %   MCV 88.7 80.0 - 100.0 fL   MCH 29.7 26.0 - 34.0 pg   MCHC 33.5 30.0 - 36.0 g/dL   RDW 66.4 40.3 - 47.4 %   Platelets 200 150 - 400 K/uL   nRBC 0.0 0.0 - 0.2 %  Basic metabolic panel     Status: Abnormal   Collection Time: 03/22/23  6:23 AM  Result Value Ref Range   Sodium 138 135 - 145 mmol/L   Potassium 3.6 3.5 - 5.1 mmol/L   Chloride 102 98 - 111 mmol/L   CO2 26 22 - 32 mmol/L   Glucose, Bld 113 (H) 70 - 99 mg/dL   BUN 11 6 - 20 mg/dL   Creatinine, Ser 2.59 0.61 - 1.24 mg/dL   Calcium  8.2 (L) 8.9 - 10.3 mg/dL   GFR, Estimated >56 >38 mL/min   Anion gap 10 5 - 15     PHYSICAL EXAM:   Gen: sitting up in bed, NAD, looks good Lungs: unlabored Cardiac: reg Abd: + BS, NTND Ext:       Left upper extremity              Dressing stable and clean              Ext warm              Moderate ecchymosis but stable             Swelling stable  Given his ipsilateral proximal humerus fracture a will be difficult for him to mobilize effectively for ambulation and therefore really is bed to chair transfers only               He will be nonweightbearing on his left leg for 8 weeks after definitive fixation               Continue with aggressive toe and ankle motion as tolerated for now               Continue to monitor any issues with his wounds on his great toe   - Pain management:             Multimodal   - ABL anemia/Hemodynamics             Stable, monitor   - Medical issues              Bronchitis                         Home meds  - DVT/PE prophylaxis:             Lovenox             SCD to right leg   - ID:              Perioperative antibiotics - Metabolic Bone Disease:             Vitamin D levels look okay   - Activity:             As above   - FEN/GI prophylaxis/Foley/Lines:             Regular diet             Saline lock IV   -Ex-fix/Splint care:             Okay to manipulate left leg by fixator   - Dispo:             Therapy evals             Aggressive ice and elevation left leg             Plan for surgery on Tuesday, 03/27/2023.  Will keep inpatient  until then   Mearl Latin, PA-C [phone] (C) 03/22/2023, 11:01 AM  [facility] [address] [place] [address] [phone] [phone] (F)    After 5pm and on the weekends please log on to Amion, go to orthopaedics and the look under the [facility] Call for the provider(s) on call. You can also call our office at [phone] and then follow the prompts to be connected to the call team.  Patient ID: Harry Morales, male   DOB: 1970-09-11, 52 y.o.   MRN: [id number]
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
## 2023-03-23 LAB — CBC
HCT: 33.8 % — ABNORMAL LOW (ref 39.0–52.0)
Hemoglobin: 11.2 g/dL — ABNORMAL LOW (ref 13.0–17.0)
MCH: 28.6 pg (ref 26.0–34.0)
MCHC: 33.1 g/dL (ref 30.0–36.0)
MCV: 86.2 fL (ref 80.0–100.0)
Platelets: 242 10*3/uL (ref 150–400)
RBC: 3.92 MIL/uL — ABNORMAL LOW (ref 4.22–5.81)
RDW: 13.2 % (ref 11.5–15.5)
WBC: 8.6 10*3/uL (ref 4.0–10.5)
nRBC: 0 % (ref 0.0–0.2)

## 2023-03-23 NOTE — Plan of Care (Signed)
  Problem: Activity: Goal: Risk for activity intolerance will decrease Outcome: Progressing   Problem: Nutrition: Goal: Adequate nutrition will be maintained Outcome: Progressing   Problem: Pain Managment: Goal: General experience of comfort will improve Outcome: Progressing   

## 2023-03-23 NOTE — Progress Notes (Signed)
Bronchitis                         Home meds   - DVT/PE prophylaxis:             Lovenox             SCD to right leg   - ID:              Perioperative antibiotics - Metabolic Bone Disease:             Vitamin D levels look okay   - Activity:             As above   - FEN/GI prophylaxis/Foley/Lines:             Regular diet             Saline lock IV   -Ex-fix/Splint care:             Okay to manipulate left leg by fixator   - Dispo:             Therapy evals             Aggressive ice and elevation left leg             Plan for surgery on Tuesday, 03/27/2023.  Will keep inpatient until then    Mearl Latin, PA-C [phone] (C) 03/23/2023, 10:35 AM  [facility] [address] [place] [address] [phone] [phone] (F)    After 5pm and on the weekends please log on to Amion, go to orthopaedics and the look under the [facility] Call for the provider(s) on call. You can also call our office at [phone] and then follow the prompts to be connected to the call team.  Patient ID: Harry Morales, male   DOB: 1970-09-03, 52 y.o.   MRN: [id number]  Orthopaedic Trauma Service Progress Note  Patient ID: Harry Morales MRN: [id number] DOB/AGE: [birth year] 52 y.o.  Subjective:  No complaints Doing well Pain controlled  Works for [employer], [employer] Has been with the city for 24 years  ROS As above  Objective:   VITALS:   Vitals:   03/22/23 0734 03/22/23 1458 03/22/23 2008 03/23/23 0454  BP: 123/83 104/85 116/79 (!) 149/131  Pulse: 94 (!) 108  (!) 102  Resp:   18 18  Temp: 98.1 F (36.7 C) 97.9 F (36.6 C) 98.3 F (36.8 C) 98.4 F (36.9 C)  TempSrc: Oral Oral Oral Oral  SpO2: 97% 96% 98% 97%  Weight:      Height:        Estimated body mass index is 24.03 kg/m as calculated from the following:   Height as of this encounter: 5\' 4"  (1.626 m).   Weight as of this encounter: 63.5 kg.   Intake/Output      10/10 0701 10/11 0700 10/11 0701 10/12 0700   P.O. 240    Total Intake(mL/kg) 240 (3.8)    Urine (mL/kg/hr) 300 (0.2)    Total Output 300    Net -60           LABS  Results for orders placed or performed during the hospital encounter of 03/19/23 (from the past 24 hour(s))  CBC     Status: Abnormal   Collection Time: 03/23/23  6:12 AM  Result Value Ref Range   WBC 8.6 4.0 - 10.5 K/uL   RBC 3.92 (L) 4.22 - 5.81 MIL/uL   Hemoglobin 11.2 (L) 13.0 - 17.0 g/dL   HCT 40.9 (L) 81.1 - 91.4 %   MCV 86.2 80.0 - 100.0 fL   MCH 28.6 26.0 - 34.0 pg   MCHC 33.1 30.0 - 36.0 g/dL   RDW 78.2 95.6 - 21.3 %   Platelets 242 150 - 400 K/uL   nRBC 0.0 0.0 - 0.2 %     PHYSICAL EXAM:   Gen: sitting up in bed, NAD, looks good Lungs: unlabored Cardiac: reg Abd: + BS, NTND Ext:       Left upper extremity              Dressing stable and clean              Ext warm              Moderate ecchymosis but stable             Swelling stable             Forearm soft             Radial, ulnar, median nerve motor and sensory function  intact.  AIN and PIN motor function intact             + radial pulse        Left lower extremity             Spanning knee external fixator intact and stable             Dressings including pin site dressings are clean and dry             DPN, SPN, TN sensory function intact  Bronchitis                         Home meds   - DVT/PE prophylaxis:             Lovenox             SCD to right leg   - ID:              Perioperative antibiotics - Metabolic Bone Disease:             Vitamin D levels look okay   - Activity:             As above   - FEN/GI prophylaxis/Foley/Lines:             Regular diet             Saline lock IV   -Ex-fix/Splint care:             Okay to manipulate left leg by fixator   - Dispo:             Therapy evals             Aggressive ice and elevation left leg             Plan for surgery on Tuesday, 03/27/2023.  Will keep inpatient until then    Mearl Latin, PA-C [phone] (C) 03/23/2023, 10:35 AM  [facility] [address] [place] [address] [phone] [phone] (F)    After 5pm and on the weekends please log on to Amion, go to orthopaedics and the look under the [facility] Call for the provider(s) on call. You can also call our office at [phone] and then follow the prompts to be connected to the call team.  Patient ID: Harry Morales, male   DOB: 1970-09-03, 52 y.o.   MRN: [id number]
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
## 2023-03-23 NOTE — Plan of Care (Signed)
  Problem: Clinical Measurements: Goal: Ability to maintain clinical measurements within normal limits will improve Outcome: Progressing Goal: Will remain free from infection Outcome: Progressing Goal: Diagnostic test results will improve Outcome: Progressing Goal: Respiratory complications will improve Outcome: Progressing Goal: Cardiovascular complication will be avoided Outcome: Progressing   Problem: Activity: Goal: Risk for activity intolerance will decrease Outcome: Progressing   Problem: Nutrition: Goal: Adequate nutrition will be maintained Outcome: Progressing   Problem: Coping: Goal: Level of anxiety will decrease Outcome: Progressing   Problem: Pain Managment: Goal: General experience of comfort will improve Outcome: Progressing   Problem: Safety: Goal: Ability to remain free from injury will improve Outcome: Progressing

## 2023-03-24 NOTE — Progress Notes (Signed)
     Subjective:  Patient reports pain as mild.  Overall doing well, looking forward to having his surgery on Tuesday.  No complaints.  Objective:   VITALS:   Vitals:   03/23/23 1047 03/23/23 1734 03/23/23 2007 03/24/23 0505  BP: 115/78 131/84 112/73 113/75  Pulse: (!) 103 95 100 93  Resp: 15 15 17 17   Temp: 98.6 F (37 C) 97.8 F (36.6 C) 98.3 F (36.8 C) 98.4 F (36.9 C)  TempSrc: Oral Oral Oral   SpO2: 95% 98% 95% 96%  Weight:      Height:        Neurologically intact Sensation intact distally Dorsiflexion/Plantar flexion intact Incision: no drainage and pin sites on the external fixator look clean.  Shoulder wound is clean, no drainage.   Lab Results  Component Value Date   WBC 8.6 03/23/2023   HGB 11.2 (L) 03/23/2023   HCT 33.8 (L) 03/23/2023   MCV 86.2 03/23/2023   PLT 242 03/23/2023   BMET    Component Value Date/Time   NA 138 03/22/2023 0623   K 3.6 03/22/2023 0623   CL 102 03/22/2023 0623   CO2 26 03/22/2023 0623   GLUCOSE 113 (H) 03/22/2023 0623   BUN 11 03/22/2023 0623   CREATININE 1.10 03/22/2023 0623   CALCIUM 8.2 (L) 03/22/2023 0623   GFRNONAA >60 03/22/2023 1610     Assessment/Plan: 4 Days Post-Op   Principal Problem:   Tibial plateau fracture, left Active Problems:   Left medial tibial plateau fracture   Closed bicondylar fracture of left tibial plateau   Doing well, continue management of soft tissue swelling, elevation, external fixation, nonweightbearing left lower extremity, nonweightbearing left upper extremity.  Plan for surgery with Dr. Carola Frost next week.  Eulas Post 03/24/2023, 6:44 AM   Teryl Lucy, MD Cell [phone]

## 2023-03-24 NOTE — Plan of Care (Signed)

## 2023-03-25 MED ORDER — POLYETHYLENE GLYCOL 3350 17 G PO PACK
17.0000 g | PACK | Freq: Two times a day (BID) | ORAL | Status: DC | PRN
  Administered 2023-03-25 – 2023-03-29 (×2): 17 g via ORAL
  Filled 2023-03-25 (×2): qty 1

## 2023-03-25 NOTE — Progress Notes (Signed)
    5 Days Post-Op Procedure(s) (LRB): EXTERNAL FIXATION LEFT KNEE (Left) OPEN REDUCTION INTERNAL FIXATION (ORIF) PROXIMAL HUMERUS FRACTURE (Left)  Subjective:  Patient reports pain as mild.  Overall doing well, looking forward to having his surgery on Tuesday.  Difficulty with bowel movements still, appetite back to normal.  Passing flatulence.  No other complaints.  Objective:   VITALS:   Vitals:   03/24/23 1517 03/24/23 2022 03/25/23 0511 03/25/23 0716  BP: 107/82 114/83 112/72 (!) 126/93  Pulse: 93 (!) 110 97 99  Resp:  18 16   Temp: 98 F (36.7 C) 97.6 F (36.4 C) 98.3 F (36.8 C) 98.2 F (36.8 C)  TempSrc: Oral Oral Oral Oral  SpO2: 97% 94% 94% 96%  Weight:      Height:        AAOx4, sitting comfortably Abd soft, nontender Neurologically intact Sensation intact distally Dorsiflexion/Plantar flexion intact Incision: no drainage and pin sites on the external fixator look clean.  Shoulder wound is clean, no drainage. Compartments soft Wiggles fingers and toes appropriately   Lab Results  Component Value Date   WBC 8.6 03/23/2023   HGB 11.2 (L) 03/23/2023   HCT 33.8 (L) 03/23/2023   MCV 86.2 03/23/2023   PLT 242 03/23/2023   BMET    Component Value Date/Time   NA 138 03/22/2023 0623   K 3.6 03/22/2023 0623   CL 102 03/22/2023 0623   CO2 26 03/22/2023 0623   GLUCOSE 113 (H) 03/22/2023 0623   BUN 11 03/22/2023 0623   CREATININE 1.10 03/22/2023 0623   CALCIUM 8.2 (L) 03/22/2023 0623   GFRNONAA >60 03/22/2023 1610     Assessment/Plan: 5 Days Post-Op   Principal Problem:   Tibial plateau fracture, left Active Problems:   Left medial tibial plateau fracture   Closed bicondylar fracture of left tibial plateau  Narrative:  03/25/23: Doing well overall.  Continue RICE.  Still NWB of LLE and LUE.  Ex-fix pin sites look okay.  Surgery planned for next week with Dr. Carola Frost.  Mild-moderate constipation, likely due to anesthesia and medications.  Has  colace, added Miralax PRN.  Still passing flatulence, abd soft nontender.  Continue to monitor, bedside commode available.   Plan: Comminuted left proximal humerus fx S/P ORIF NWB LUE.  Shoulder pendulums only.  Unrestricted ROM left elbow, forearm, wrist, hand.  Sling on with mobilization, off at rest/in bed.  RICE.  Dressings PRN Comminuted left bicondylar tibial plateau fx w/ posterior medial shear S/P external fixation NWB LLE for 8 weeks.  Surgery planned for next week with Dr. Carola Frost.  Limited mobilization, bed to chair transfers only.  Continue aggressive toe/ankle ROM as tolerated.  Monitor wounds on great toe.  Ex-fix pin care PRN Anemia: Likely post-operative, stable DVT prophylaxis: Lovenox, SCD on RLE Diet: Advance as tolerated Constipation: Has colace, added Miralax. Keep up fluid intake. DC: PT/OT eval.  Keep inpatient for now, likley surgery on Tuesday 03/27/23   Harry Morales 03/25/2023, 8:33 AM

## 2023-03-25 NOTE — Plan of Care (Signed)
  Problem: Education: Goal: Knowledge of General Education information will improve Description: Including pain rating scale, medication(s)/side effects and non-pharmacologic comfort measures Outcome: Progressing   Problem: Health Behavior/Discharge Planning: Goal: Ability to manage health-related needs will improve Outcome: Progressing   Problem: Clinical Measurements: Goal: Ability to maintain clinical measurements within normal limits will improve Outcome: Progressing Goal: Will remain free from infection Outcome: Progressing Goal: Diagnostic test results will improve Outcome: Progressing Goal: Respiratory complications will improve Outcome: Progressing Goal: Cardiovascular complication will be avoided Outcome: Progressing   Problem: Activity: Goal: Risk for activity intolerance will decrease Outcome: Progressing   Problem: Nutrition: Goal: Adequate nutrition will be maintained Outcome: Progressing   Problem: Coping: Goal: Level of anxiety will decrease Outcome: Progressing   Problem: Elimination: Goal: Will not experience complications related to bowel motility Outcome: Progressing Goal: Will not experience complications related to urinary retention Outcome: Progressing   Problem: Pain Managment: Goal: General experience of comfort will improve Outcome: Progressing   Problem: Safety: Goal: Ability to remain free from injury will improve Outcome: Progressing   Problem: Skin Integrity: Goal: Risk for impaired skin integrity will decrease Outcome: Progressing   Problem: Education: Goal: Verbalization of understanding the information provided (i.e., activity precautions, restrictions, etc) will improve Outcome: Progressing Goal: Individualized Educational Video(s) Outcome: Progressing

## 2023-03-25 NOTE — Plan of Care (Signed)
Problem: Education: Goal: Knowledge of General Education information will improve Description: Including pain rating scale, medication(s)/side effects and non-pharmacologic comfort measures 03/25/2023 0709 by Delton See, LPN Outcome: Progressing 03/25/2023 0709 by Delton See, LPN Outcome: Progressing   Problem: Health Behavior/Discharge Planning: Goal: Ability to manage health-related needs will improve 03/25/2023 0709 by Delton See, LPN Outcome: Progressing 03/25/2023 0709 by Delton See, LPN Outcome: Progressing   Problem: Clinical Measurements: Goal: Ability to maintain clinical measurements within normal limits will improve 03/25/2023 0709 by Delton See, LPN Outcome: Progressing 03/25/2023 0709 by Delton See, LPN Outcome: Progressing Goal: Will remain free from infection 03/25/2023 0709 by Delton See, LPN Outcome: Progressing 03/25/2023 0709 by Delton See, LPN Outcome: Progressing Goal: Diagnostic test results will improve 03/25/2023 0709 by Delton See, LPN Outcome: Progressing 03/25/2023 0709 by Delton See, LPN Outcome: Progressing Goal: Respiratory complications will improve 03/25/2023 0709 by Delton See, LPN Outcome: Progressing 03/25/2023 0709 by Delton See, LPN Outcome: Progressing Goal: Cardiovascular complication will be avoided 03/25/2023 0709 by Delton See, LPN Outcome: Progressing 03/25/2023 0709 by Delton See, LPN Outcome: Progressing   Problem: Activity: Goal: Risk for activity intolerance will decrease 03/25/2023 0709 by Delton See, LPN Outcome: Progressing 03/25/2023 0709 by Delton See, LPN Outcome: Progressing   Problem: Nutrition: Goal: Adequate nutrition will be maintained 03/25/2023 0709 by Delton See, LPN Outcome: Progressing 03/25/2023 0709 by Delton See, LPN Outcome: Progressing   Problem: Coping: Goal: Level of anxiety will decrease 03/25/2023 0709 by Delton See, LPN Outcome: Progressing 03/25/2023 0709 by Delton See, LPN Outcome: Progressing   Problem: Elimination: Goal: Will not experience complications related to bowel motility 03/25/2023 0709 by Delton See, LPN Outcome: Progressing 03/25/2023 0709 by Delton See, LPN Outcome: Progressing Goal: Will not experience complications related to urinary retention 03/25/2023 0709 by Delton See, LPN Outcome: Progressing 03/25/2023 0709 by Delton See, LPN Outcome: Progressing   Problem: Elimination: Goal: Will not experience complications related to urinary retention 03/25/2023 0709 by Delton See, LPN Outcome: Progressing 03/25/2023 0709 by Delton See, LPN Outcome: Progressing   Problem: Pain Managment: Goal: General experience of comfort will improve 03/25/2023 0709 by Delton See, LPN Outcome: Progressing 03/25/2023 0709 by Delton See, LPN Outcome: Progressing   Problem: Safety: Goal: Ability to remain free from injury will improve 03/25/2023 0709 by Delton See, LPN Outcome: Progressing 03/25/2023 0709 by Delton See, LPN Outcome: Progressing   Problem: Skin Integrity: Goal: Risk for impaired skin integrity will decrease 03/25/2023 0709 by Delton See, LPN Outcome: Progressing 03/25/2023 0709 by Delton See, LPN Outcome: Progressing   Problem: Education: Goal: Verbalization of understanding the information provided (i.e., activity precautions, restrictions, etc) will improve 03/25/2023 0709 by Delton See, LPN Outcome: Progressing 03/25/2023 0709 by Delton See, LPN Outcome: Progressing Goal: Individualized Educational Video(s) 03/25/2023 0709 by Delton See, LPN Outcome: Progressing 03/25/2023 0709 by Delton See, LPN Outcome: Progressing   Problem: Activity: Goal: Ability to ambulate and perform ADLs will improve 03/25/2023 0709 by Delton See, LPN Outcome: Progressing 03/25/2023 0709 by Delton See, LPN Outcome: Progressing   Problem: Clinical Measurements: Goal: Postoperative complications will be avoided  or minimized 03/25/2023 0709 by Delton See, LPN Outcome: Progressing 03/25/2023 0709 by Delton See, LPN Outcome: Progressing   Problem: Self-Concept: Goal: Ability to maintain and perform role responsibilities  to the fullest extent possible will improve 03/25/2023 0709 by Delton See, LPN Outcome: Progressing 03/25/2023 0709 by Delton See, LPN Outcome: Progressing   Problem: Pain Management: Goal: Pain level will decrease 03/25/2023 0709 by Delton See, LPN Outcome: Progressing 03/25/2023 0709 by Delton See, LPN Outcome: Progressing   Problem: Education: Goal: Knowledge of the prescribed therapeutic regimen will improve 03/25/2023 0709 by Delton See, LPN Outcome: Progressing 03/25/2023 0709 by Delton See, LPN Outcome: Progressing Goal: Understanding of activity limitations/precautions following surgery will improve 03/25/2023 0709 by Delton See, LPN Outcome: Progressing 03/25/2023 0709 by Delton See, LPN Outcome: Progressing Goal: Individualized Educational Video(s) 03/25/2023 0709 by Delton See, LPN Outcome: Progressing 03/25/2023 0709 by Delton See, LPN Outcome: Progressing   Problem: Activity: Goal: Ability to tolerate increased activity will improve 03/25/2023 0709 by Delton See, LPN Outcome: Progressing 03/25/2023 0709 by Delton See, LPN Outcome: Progressing   Problem: Pain Management: Goal: Pain level will decrease with appropriate interventions 03/25/2023 0709 by Delton See, LPN Outcome: Progressing 03/25/2023 0709 by Delton See, LPN Outcome: Progressing

## 2023-03-26 ENCOUNTER — Encounter (HOSPITAL_COMMUNITY): Payer: Self-pay | Admitting: Orthopedic Surgery

## 2023-03-26 LAB — URINALYSIS, ROUTINE W REFLEX MICROSCOPIC
Glucose, UA: NEGATIVE mg/dL
Hgb urine dipstick: NEGATIVE
Ketones, ur: NEGATIVE mg/dL
Leukocytes,Ua: NEGATIVE
Nitrite: NEGATIVE
Protein, ur: NEGATIVE mg/dL
Specific Gravity, Urine: 1.033 — ABNORMAL HIGH (ref 1.005–1.030)
pH: 5 (ref 5.0–8.0)

## 2023-03-26 LAB — SURGICAL PCR SCREEN
MRSA, PCR: NEGATIVE
Staphylococcus aureus: NEGATIVE

## 2023-03-26 MED ORDER — CEFAZOLIN SODIUM-DEXTROSE 2-4 GM/100ML-% IV SOLN
2.0000 g | INTRAVENOUS | Status: AC
  Administered 2023-03-27 (×2): 2 g via INTRAVENOUS
  Filled 2023-03-26: qty 100

## 2023-03-26 NOTE — Progress Notes (Signed)
Orthopedic Tech Progress Note Patient Details:  TANOR GLASPY 05-Aug-1970 [id number]  Ortho Devices Type of Ortho Device: Prafo boot/shoe Ortho Device/Splint Location: Bulky Compressive Wrap/ LLE Ortho Device/Splint Interventions: Ordered, Application   Post Interventions Patient Tolerated: Well Instructions Provided: Care of device, Adjustment of device  Elenna Spratling A Naiyana Barbian 03/26/2023, 12:24 PM

## 2023-03-26 NOTE — Progress Notes (Signed)
Orthopaedic Trauma Service Progress Note  Patient ID: Harry Morales MRN: [id number] DOB/AGE: Aug 12, 1970 52 y.o.  Subjective:  No complaints Ready for surgery tomorrow  Tolerating diet  States he is voiding well but urine is a little dark in urinal.  IV fluids have been restricted due to IVF shortage related to [facility]   ROS As above Objective:   VITALS:   Vitals:   03/25/23 1509 03/25/23 1943 03/26/23 0512 03/26/23 0715  BP: 110/71 113/69 126/79 123/76  Pulse: 98 99 85 94  Resp:  18 18   Temp: 98.3 F (36.8 C) 98 F (36.7 C) 97.8 F (36.6 C) 98.2 F (36.8 C)  TempSrc: Oral Oral  Oral  SpO2: 97% 95% 96% 97%  Weight:      Height:        Estimated body mass index is 24.03 kg/m as calculated from the following:   Height as of this encounter: 5\' 4"  (1.626 m).   Weight as of this encounter: 63.5 kg.   Intake/Output      10/13 0701 10/14 0700 10/14 0701 10/15 0700   Urine (mL/kg/hr) 400 (0.3) 325 (2)   Total Output 400 325   Net -400 -325          LABS  No results found for this or any previous visit (from the past 24 hour(s)).   PHYSICAL EXAM:   Gen: sitting up in bed, NAD, looks good Lungs: unlabored Cardiac: reg Abd: + BS, NTND Ext:       Left upper extremity              Dressing stable and clean              Ext warm              Moderate ecchymosis but stable             Swelling stable             Forearm soft             Radial, ulnar, median nerve motor and sensory function intact.  AIN and PIN motor function intact             + radial pulse        Left lower extremity             Spanning knee external fixator intact and stable             Dressings including pin site dressings are clean and dry   Dressings removed   Pinsites look good    New kerlix applied   Despite a healthy layer of webril pt does have some small pressure sores over anterior and  medial ankle    Will have him re-wrapped but leaving ankle free              DPN, SPN, TN sensory function intact             EHL, FHL, lesser toe motor function intact             Ankle flexion, extension, inversion and eversion intact             + DP pulse             Swelling improving  Home meds   - DVT/PE prophylaxis:             Lovenox             SCD to right leg   - ID:              Perioperative antibiotics  - Metabolic Bone Disease:             Vitamin D levels look okay   - Activity:             As above   - FEN/GI prophylaxis/Foley/Lines:             Regular diet             Saline lock IV   -Ex-fix/Splint care:             Okay to manipulate left leg by fixator   - Dispo:             NPO after MN  OR tomorrow for L tibial plateau    Mearl Latin, PA-C [phone] (C) 03/26/2023, 9:36 AM  [facility] [address] [place] [address] [phone] [phone] (F)    After 5pm and on the weekends please log on to Amion, go to orthopaedics and the look under the [facility] Call for the provider(s) on call. You can also call our office at [phone] and then follow the prompts to be connected to the call team.  Patient ID: Harry Morales, male   DOB: 1971-02-14, 52 y.o.   MRN: [id number]  Home meds   - DVT/PE prophylaxis:             Lovenox             SCD to right leg   - ID:              Perioperative antibiotics  - Metabolic Bone Disease:             Vitamin D levels look okay   - Activity:             As above   - FEN/GI prophylaxis/Foley/Lines:             Regular diet             Saline lock IV   -Ex-fix/Splint care:             Okay to manipulate left leg by fixator   - Dispo:             NPO after MN  OR tomorrow for L tibial plateau    Mearl Latin, PA-C [phone] (C) 03/26/2023, 9:36 AM  [facility] [address] [place] [address] [phone] [phone] (F)    After 5pm and on the weekends please log on to Amion, go to orthopaedics and the look under the [facility] Call for the provider(s) on call. You can also call our office at [phone] and then follow the prompts to be connected to the call team.  Patient ID: Harry Morales, male   DOB: 1971-02-14, 52 y.o.   MRN: [id number]
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
## 2023-03-26 NOTE — Progress Notes (Signed)
PT Cancellation Note  Patient Details Name: Harry Morales MRN: [id number] DOB: 1970/10/25   Cancelled Treatment:    Reason Eval/Treat Not Completed: Patient declined, no reason specified (Pt reports that he is not feeling well today. Pt reports he has been nauseous today and politely requests PT to come back after his surgery tomorrow. Will follow up.)   Gladys Damme 03/26/2023, 3:27 PM
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
## 2023-03-27 ENCOUNTER — Inpatient Hospital Stay (HOSPITAL_COMMUNITY): Payer: 59 | Admitting: Anesthesiology

## 2023-03-27 ENCOUNTER — Other Ambulatory Visit: Payer: Self-pay

## 2023-03-27 ENCOUNTER — Encounter (HOSPITAL_COMMUNITY): Payer: Self-pay | Admitting: Orthopedic Surgery

## 2023-03-27 ENCOUNTER — Encounter (HOSPITAL_COMMUNITY)
Admission: EM | Disposition: A | Discharge status: Home Health | Payer: Self-pay | Source: Home / Self Care | Attending: Orthopedic Surgery

## 2023-03-27 ENCOUNTER — Inpatient Hospital Stay (HOSPITAL_COMMUNITY): Payer: 59

## 2023-03-27 DIAGNOSIS — S82142A Displaced bicondylar fracture of left tibia, initial encounter for closed fracture: Secondary | ICD-10-CM | POA: Diagnosis not present

## 2023-03-27 HISTORY — PX: EXTERNAL FIXATION REMOVAL: SHX5040

## 2023-03-27 HISTORY — PX: ORIF TIBIA PLATEAU: SHX2132

## 2023-03-27 LAB — COMPREHENSIVE METABOLIC PANEL
ALT: 199 U/L — ABNORMAL HIGH (ref 0–44)
AST: 110 U/L — ABNORMAL HIGH (ref 15–41)
Albumin: 2.7 g/dL — ABNORMAL LOW (ref 3.5–5.0)
Alkaline Phosphatase: 194 U/L — ABNORMAL HIGH (ref 38–126)
Anion gap: 10 (ref 5–15)
BUN: 12 mg/dL (ref 6–20)
CO2: 27 mmol/L (ref 22–32)
Calcium: 9 mg/dL (ref 8.9–10.3)
Chloride: 99 mmol/L (ref 98–111)
Creatinine, Ser: 0.95 mg/dL (ref 0.61–1.24)
GFR, Estimated: >60 mL/min (ref >60)
Glucose, Bld: 101 mg/dL — ABNORMAL HIGH (ref 70–99)
Potassium: 3.8 mmol/L (ref 3.5–5.1)
Sodium: 136 mmol/L (ref 135–145)
Total Bilirubin: 1.1 mg/dL (ref 0.3–1.2)
Total Protein: 6.2 g/dL — ABNORMAL LOW (ref 6.5–8.1)

## 2023-03-27 LAB — CBC
HCT: 33.6 % — ABNORMAL LOW (ref 39.0–52.0)
Hemoglobin: 11.1 g/dL — ABNORMAL LOW (ref 13.0–17.0)
MCH: 28.8 pg (ref 26.0–34.0)
MCHC: 33 g/dL (ref 30.0–36.0)
MCV: 87.3 fL (ref 80.0–100.0)
Platelets: 395 10*3/uL (ref 150–400)
RBC: 3.85 MIL/uL — ABNORMAL LOW (ref 4.22–5.81)
RDW: 13.6 % (ref 11.5–15.5)
WBC: 11.4 10*3/uL — ABNORMAL HIGH (ref 4.0–10.5)
nRBC: 0 % (ref 0.0–0.2)

## 2023-03-27 SURGERY — OPEN REDUCTION INTERNAL FIXATION (ORIF) TIBIAL PLATEAU
Anesthesia: General | Site: Leg Lower | Laterality: Left

## 2023-03-27 MED ORDER — ACETAMINOPHEN 10 MG/ML IV SOLN
INTRAVENOUS | Status: AC
  Filled 2023-03-27: qty 100

## 2023-03-27 MED ORDER — PHENYLEPHRINE 80 MCG/ML (10ML) SYRINGE FOR IV PUSH (FOR BLOOD PRESSURE SUPPORT)
PREFILLED_SYRINGE | INTRAVENOUS | Status: DC | PRN
  Administered 2023-03-27 (×2): 80 ug via INTRAVENOUS

## 2023-03-27 MED ORDER — SCOPOLAMINE 1 MG/3DAYS TD PT72
MEDICATED_PATCH | TRANSDERMAL | Status: AC
  Administered 2023-03-27: 1.5 mg via TRANSDERMAL
  Filled 2023-03-27: qty 1

## 2023-03-27 MED ORDER — MEPERIDINE HCL 25 MG/ML IJ SOLN: 6.2500 mg | INTRAMUSCULAR | Status: DC | PRN

## 2023-03-27 MED ORDER — PHENYLEPHRINE 80 MCG/ML (10ML) SYRINGE FOR IV PUSH (FOR BLOOD PRESSURE SUPPORT)
PREFILLED_SYRINGE | INTRAVENOUS | Status: AC
  Filled 2023-03-27: qty 10

## 2023-03-27 MED ORDER — LACTATED RINGERS IV SOLN: INTRAVENOUS | Status: DC | PRN

## 2023-03-27 MED ORDER — CEFAZOLIN SODIUM-DEXTROSE 1-4 GM/50ML-% IV SOLN
1.0000 g | Freq: Four times a day (QID) | INTRAVENOUS | Status: AC
  Administered 2023-03-27 – 2023-03-28 (×2): 1 g via INTRAVENOUS
  Filled 2023-03-27 (×3): qty 50

## 2023-03-27 MED ORDER — MIDAZOLAM HCL 2 MG/2ML IJ SOLN
INTRAMUSCULAR | Status: DC | PRN
  Administered 2023-03-27 (×2): 1 mg via INTRAVENOUS

## 2023-03-27 MED ORDER — OXYCODONE HCL 5 MG PO TABS
ORAL_TABLET | ORAL | Status: AC
  Filled 2023-03-27: qty 1

## 2023-03-27 MED ORDER — ORAL CARE MOUTH RINSE: 15.0000 mL | Freq: Once | OROMUCOSAL | Status: AC

## 2023-03-27 MED ORDER — FENTANYL CITRATE (PF) 250 MCG/5ML IJ SOLN
INTRAMUSCULAR | Status: AC
  Filled 2023-03-27: qty 5

## 2023-03-27 MED ORDER — ROCURONIUM BROMIDE 10 MG/ML (PF) SYRINGE
PREFILLED_SYRINGE | INTRAVENOUS | Status: DC | PRN
  Administered 2023-03-27: 20 mg via INTRAVENOUS
  Administered 2023-03-27: 30 mg via INTRAVENOUS
  Administered 2023-03-27: 50 mg via INTRAVENOUS
  Administered 2023-03-27: 30 mg via INTRAVENOUS

## 2023-03-27 MED ORDER — DIAZEPAM 5 MG PO TABS: 5.0000 mg | ORAL_TABLET | Freq: Two times a day (BID) | ORAL | Status: DC | PRN

## 2023-03-27 MED ORDER — ONDANSETRON HCL 4 MG/2ML IJ SOLN
INTRAMUSCULAR | Status: AC
  Filled 2023-03-27: qty 2

## 2023-03-27 MED ORDER — SUGAMMADEX SODIUM 200 MG/2ML IV SOLN
INTRAVENOUS | Status: DC | PRN
  Administered 2023-03-27: 127 mg via INTRAVENOUS

## 2023-03-27 MED ORDER — CHLORHEXIDINE GLUCONATE 0.12 % MT SOLN
OROMUCOSAL | Status: AC
  Administered 2023-03-27: 15 mL via OROMUCOSAL
  Filled 2023-03-27: qty 15

## 2023-03-27 MED ORDER — CHLORHEXIDINE GLUCONATE CLOTH 2 % EX PADS
6.0000 | MEDICATED_PAD | Freq: Every day | CUTANEOUS | Status: DC
  Administered 2023-03-30: 6 via TOPICAL

## 2023-03-27 MED ORDER — POTASSIUM CHLORIDE IN NACL 20-0.9 MEQ/L-% IV SOLN
INTRAVENOUS | Status: AC
  Filled 2023-03-27: qty 1000

## 2023-03-27 MED ORDER — SCOPOLAMINE 1 MG/3DAYS TD PT72: 1.0000 | MEDICATED_PATCH | TRANSDERMAL | Status: DC

## 2023-03-27 MED ORDER — ONDANSETRON HCL 4 MG/2ML IJ SOLN
INTRAMUSCULAR | Status: DC | PRN
  Administered 2023-03-27: 4 mg via INTRAVENOUS

## 2023-03-27 MED ORDER — CHLORHEXIDINE GLUCONATE 0.12 % MT SOLN: 15.0000 mL | Freq: Once | OROMUCOSAL | Status: AC

## 2023-03-27 MED ORDER — PROPOFOL 10 MG/ML IV BOLUS
INTRAVENOUS | Status: DC | PRN
  Administered 2023-03-27: 110 mg via INTRAVENOUS

## 2023-03-27 MED ORDER — HYDROMORPHONE HCL 1 MG/ML IJ SOLN
0.5000 mg | INTRAMUSCULAR | Status: DC | PRN
  Administered 2023-03-27 – 2023-03-28 (×2): 1 mg via INTRAVENOUS
  Filled 2023-03-27 (×2): qty 1

## 2023-03-27 MED ORDER — DOCUSATE SODIUM 100 MG PO CAPS
100.0000 mg | ORAL_CAPSULE | Freq: Two times a day (BID) | ORAL | Status: DC
  Administered 2023-03-27 – 2023-03-30 (×6): 100 mg via ORAL
  Filled 2023-03-27 (×6): qty 1

## 2023-03-27 MED ORDER — LIDOCAINE 2% (20 MG/ML) 5 ML SYRINGE
INTRAMUSCULAR | Status: DC | PRN
  Administered 2023-03-27: 60 mg via INTRAVENOUS

## 2023-03-27 MED ORDER — ROCURONIUM BROMIDE 10 MG/ML (PF) SYRINGE
PREFILLED_SYRINGE | INTRAVENOUS | Status: AC
  Filled 2023-03-27: qty 30

## 2023-03-27 MED ORDER — MIDAZOLAM HCL 2 MG/2ML IJ SOLN
INTRAMUSCULAR | Status: AC
  Filled 2023-03-27: qty 2

## 2023-03-27 MED ORDER — 0.9 % SODIUM CHLORIDE (POUR BTL) OPTIME
TOPICAL | Status: DC | PRN
  Administered 2023-03-27: 1000 mL

## 2023-03-27 MED ORDER — OXYCODONE HCL 5 MG PO TABS
5.0000 mg | ORAL_TABLET | ORAL | Status: DC | PRN
  Administered 2023-03-27: 5 mg via ORAL
  Administered 2023-03-28 – 2023-03-30 (×3): 10 mg via ORAL
  Filled 2023-03-27 (×3): qty 2
  Filled 2023-03-27: qty 1

## 2023-03-27 MED ORDER — OXYCODONE HCL 5 MG PO TABS
15.0000 mg | ORAL_TABLET | ORAL | Status: DC | PRN
  Administered 2023-03-28 – 2023-03-30 (×5): 15 mg via ORAL
  Filled 2023-03-27 (×5): qty 3

## 2023-03-27 MED ORDER — OXYCODONE HCL 5 MG PO TABS
5.0000 mg | ORAL_TABLET | Freq: Once | ORAL | Status: AC
  Administered 2023-03-27: 5 mg via ORAL

## 2023-03-27 MED ORDER — DEXAMETHASONE SODIUM PHOSPHATE 10 MG/ML IJ SOLN
INTRAMUSCULAR | Status: DC | PRN
  Administered 2023-03-27: 10 mg via INTRAVENOUS

## 2023-03-27 MED ORDER — ACETAMINOPHEN 10 MG/ML IV SOLN
1000.0000 mg | Freq: Once | INTRAVENOUS | Status: AC
  Administered 2023-03-27: 1000 mg via INTRAVENOUS

## 2023-03-27 MED ORDER — SODIUM CHLORIDE (PF) 0.9 % IJ SOLN
INTRAMUSCULAR | Status: AC
  Filled 2023-03-27: qty 20

## 2023-03-27 MED ORDER — LIDOCAINE 2% (20 MG/ML) 5 ML SYRINGE
INTRAMUSCULAR | Status: AC
  Filled 2023-03-27: qty 15

## 2023-03-27 MED ORDER — HYDROMORPHONE HCL 1 MG/ML IJ SOLN
INTRAMUSCULAR | Status: DC | PRN
Start: 2023-03-27 — End: 2023-03-27
  Administered 2023-03-27: .5 mg via INTRAVENOUS

## 2023-03-27 MED ORDER — HYDROMORPHONE HCL 1 MG/ML IJ SOLN
INTRAMUSCULAR | Status: AC
  Filled 2023-03-27: qty 0.5

## 2023-03-27 MED ORDER — HYDROMORPHONE HCL 1 MG/ML IJ SOLN: 0.2500 mg | INTRAMUSCULAR | Status: DC | PRN

## 2023-03-27 MED ORDER — ALBUMIN HUMAN 5 % IV SOLN
INTRAVENOUS | Status: DC | PRN
Start: 2023-03-27 — End: 2023-03-27

## 2023-03-27 MED ORDER — DROPERIDOL 2.5 MG/ML IJ SOLN: 0.6250 mg | Freq: Once | INTRAMUSCULAR | Status: DC | PRN

## 2023-03-27 MED ORDER — DEXAMETHASONE SODIUM PHOSPHATE 10 MG/ML IJ SOLN
INTRAMUSCULAR | Status: AC
  Filled 2023-03-27: qty 1

## 2023-03-27 MED ORDER — FENTANYL CITRATE (PF) 250 MCG/5ML IJ SOLN
INTRAMUSCULAR | Status: DC | PRN
  Administered 2023-03-27 (×4): 50 ug via INTRAVENOUS
  Administered 2023-03-27 (×2): 100 ug via INTRAVENOUS
  Administered 2023-03-27 (×2): 50 ug via INTRAVENOUS

## 2023-03-27 SURGICAL SUPPLY — 119 items
BAG COUNTER SPONGE SURGICOUNT (BAG) ×1 IMPLANT
BAG SPNG CNTER NS LX DISP (BAG) ×1
BANDAGE ESMARK 6X9 LF (GAUZE/BANDAGES/DRESSINGS) ×1 IMPLANT
BIT DRILL 110X30X2XCALB (BIT) IMPLANT
BIT DRILL CALIBR QC 2.5X250 (BIT) IMPLANT
BIT DRILL CALIBR QC 2.8X250 (BIT) IMPLANT
BIT DRILL QC 2.0X100 (BIT) ×1
BIT DRILL QC SFS 2.5X170 (BIT) IMPLANT
BIT DRL 110X30X2XCALB (BIT) ×1
BLADE CLIPPER SURG (BLADE) IMPLANT
BLADE SURG 10 STRL SS (BLADE) ×1 IMPLANT
BLADE SURG 15 STRL LF DISP TIS (BLADE) ×1 IMPLANT
BLADE SURG 15 STRL SS (BLADE)
BNDG CMPR 5X4 KNIT ELC UNQ LF (GAUZE/BANDAGES/DRESSINGS) ×2
BNDG CMPR 6 X 5 YARDS HK CLSR (GAUZE/BANDAGES/DRESSINGS) ×2
BNDG CMPR 9X6 STRL LF SNTH (GAUZE/BANDAGES/DRESSINGS)
BNDG COHESIVE 4X5 TAN STRL (GAUZE/BANDAGES/DRESSINGS) ×1 IMPLANT
BNDG ELASTIC 4INX 5YD STR LF (GAUZE/BANDAGES/DRESSINGS) IMPLANT
BNDG ELASTIC 4X5.8 VLCR STR LF (GAUZE/BANDAGES/DRESSINGS) ×1 IMPLANT
BNDG ELASTIC 6INX 5YD STR LF (GAUZE/BANDAGES/DRESSINGS) IMPLANT
BNDG ELASTIC 6X5.8 VLCR STR LF (GAUZE/BANDAGES/DRESSINGS) ×1 IMPLANT
BNDG ESMARK 6X9 LF (GAUZE/BANDAGES/DRESSINGS)
BNDG GAUZE DERMACEA FLUFF 4 (GAUZE/BANDAGES/DRESSINGS) ×2 IMPLANT
BNDG GZE DERMACEA 4 6PLY (GAUZE/BANDAGES/DRESSINGS) ×2
BONE CANC CHIPS 20CC PCAN1/4 (Bone Implant) ×1 IMPLANT
BRUSH SCRUB EZ PLAIN DRY (MISCELLANEOUS) ×2 IMPLANT
CANISTER SUCT 3000ML PPV (MISCELLANEOUS) ×1 IMPLANT
COVER SURGICAL LIGHT HANDLE (MISCELLANEOUS) ×2 IMPLANT
CUFF TOURN SGL QUICK 34 (TOURNIQUET CUFF)
CUFF TRNQT CYL 34X4.125X (TOURNIQUET CUFF) ×1 IMPLANT
DRAIN PENROSE 12X.25 LTX STRL (MISCELLANEOUS) IMPLANT
DRAPE C-ARM 42X72 X-RAY (DRAPES) ×1 IMPLANT
DRAPE C-ARMOR (DRAPES) ×1 IMPLANT
DRAPE HALF SHEET 40X57 (DRAPES) IMPLANT
DRAPE INCISE IOBAN 66X45 STRL (DRAPES) ×1 IMPLANT
DRAPE U-SHAPE 47X51 STRL (DRAPES) ×1 IMPLANT
DRESSING MEPILEX FLEX 4X4 (GAUZE/BANDAGES/DRESSINGS) IMPLANT
DRSG ADAPTIC 3X8 NADH LF (GAUZE/BANDAGES/DRESSINGS) ×1 IMPLANT
DRSG MEPILEX FLEX 4X4 (GAUZE/BANDAGES/DRESSINGS) ×4
DRSG MEPITEL 4X7.2 (GAUZE/BANDAGES/DRESSINGS) IMPLANT
ELECT REM PT RETURN 9FT ADLT (ELECTROSURGICAL) ×1
ELECTRODE REM PT RTRN 9FT ADLT (ELECTROSURGICAL) ×1 IMPLANT
GAUZE PAD ABD 8X10 STRL (GAUZE/BANDAGES/DRESSINGS) ×2 IMPLANT
GAUZE SPONGE 4X4 12PLY STRL (GAUZE/BANDAGES/DRESSINGS) ×1 IMPLANT
GLOVE BIO SURGEON STRL SZ7.5 (GLOVE) ×1 IMPLANT
GLOVE BIO SURGEON STRL SZ8 (GLOVE) ×1 IMPLANT
GLOVE BIOGEL PI IND STRL 7.5 (GLOVE) ×1 IMPLANT
GLOVE BIOGEL PI IND STRL 8 (GLOVE) ×1 IMPLANT
GLOVE SURG ORTHO LTX SZ7.5 (GLOVE) ×2 IMPLANT
GOWN STRL REUS W/ TWL LRG LVL3 (GOWN DISPOSABLE) ×2 IMPLANT
GOWN STRL REUS W/ TWL XL LVL3 (GOWN DISPOSABLE) ×1 IMPLANT
GOWN STRL REUS W/TWL LRG LVL3 (GOWN DISPOSABLE) ×2
GOWN STRL REUS W/TWL XL LVL3 (GOWN DISPOSABLE) ×1
GOWN STRL SURGICAL XL XLNG (GOWN DISPOSABLE) IMPLANT
GRAFT BNE CANC CHIPS 1-8 20CC (Bone Implant) IMPLANT
IMMOBILIZER KNEE 22 UNIV (SOFTGOODS) ×1 IMPLANT
K-WIRE 1.6X150 (WIRE) ×1
KIT BASIN OR (CUSTOM PROCEDURE TRAY) ×1 IMPLANT
KIT TURNOVER KIT B (KITS) ×1 IMPLANT
KWIRE 1.6X150 (WIRE) IMPLANT
MANIFOLD NEPTUNE II (INSTRUMENTS) ×1 IMPLANT
NDL KEITH (NEEDLE) IMPLANT
NDL MAYO TROCAR (NEEDLE) IMPLANT
NDL SUT 6 .5 CRC .975X.05 MAYO (NEEDLE) IMPLANT
NEEDLE KEITH (NEEDLE) ×1
NEEDLE MAYO TAPER (NEEDLE)
NEEDLE MAYO TROCAR (NEEDLE) ×1
NS IRRIG 1000ML POUR BTL (IV SOLUTION) ×1 IMPLANT
PACK ORTHO EXTREMITY (CUSTOM PROCEDURE TRAY) ×1 IMPLANT
PAD ARMBOARD 7.5X6 YLW CONV (MISCELLANEOUS) ×2 IMPLANT
PAD CAST 4YDX4 CTTN HI CHSV (CAST SUPPLIES) ×1 IMPLANT
PADDING CAST COTTON 4X4 STRL (CAST SUPPLIES) ×2
PADDING CAST COTTON 6X4 STRL (CAST SUPPLIES) ×3 IMPLANT
PLATE PROX TIBIA LEFT (Plate) IMPLANT
PLATE TIBIA PROX 6 H 35MM (Orthopedic Implant) IMPLANT
RETRIEVER SUT HEWSON (MISCELLANEOUS) IMPLANT
SCREW LOCK CORT ST 3.5X20 (Screw) IMPLANT
SCREW LOCK CORT ST 3.5X22 (Screw) IMPLANT
SCREW LOCK CORT ST 3.5X24 (Screw) IMPLANT
SCREW LOCK CORT ST 3.5X26 (Screw) IMPLANT
SCREW LOCK CORT ST 3.5X28 (Screw) IMPLANT
SCREW LOCK CORT ST 3.5X30 (Screw) IMPLANT
SCREW LOCK CORT ST 3.5X32 (Screw) IMPLANT
SCREW LOCK T15 FT 38X3.5XST (Screw) IMPLANT
SCREW LOCKING 3.5X38 (Screw) ×1 IMPLANT
SCREW LOCKING 3.5X70MM VA (Screw) IMPLANT
SCREW LOCKING 3.5X80MM VA (Screw) IMPLANT
SCREW LOCKING SLF TAP 3.5X70MM (Screw) IMPLANT
SCREW LOCKING VA 3.5X75MM (Screw) IMPLANT
SCREW VA-LOCKING 65MM 3.5 (Screw) IMPLANT
SPONGE T-LAP 18X18 ~~LOC~~+RFID (SPONGE) ×1 IMPLANT
STAPLER VISISTAT 35W (STAPLE) ×1 IMPLANT
STOCKINETTE IMPERVIOUS LG (DRAPES) ×1 IMPLANT
SUCTION TUBE FRAZIER 10FR DISP (SUCTIONS) ×1 IMPLANT
SUT 2 FIBERLOOP 20 STRT BLUE (SUTURE) ×1
SUT ETHILON 2 0 FS 18 (SUTURE) IMPLANT
SUT FIBERWIRE #2 38 T-5 BLUE (SUTURE) ×1
SUT FIBERWIRE #5 38 CONV NDL (SUTURE) ×1
SUT PDS AB 2-0 CT1 27 (SUTURE) IMPLANT
SUT PROLENE 0 CT 2 (SUTURE) ×2 IMPLANT
SUT VIC AB 0 CT1 27 (SUTURE) ×3
SUT VIC AB 0 CT1 27XBRD ANBCTR (SUTURE) ×1 IMPLANT
SUT VIC AB 1 CT1 27 (SUTURE) ×2
SUT VIC AB 1 CT1 27XBRD ANBCTR (SUTURE) ×1 IMPLANT
SUT VIC AB 1 CTX 27 (SUTURE) IMPLANT
SUT VIC AB 2-0 CT1 27 (SUTURE) ×3
SUT VIC AB 2-0 CT1 TAPERPNT 27 (SUTURE) ×2 IMPLANT
SUT VIC AB 3-0 X1 27 (SUTURE) IMPLANT
SUTURE 2 FIBERLOOP 20 STRT BLU (SUTURE) IMPLANT
SUTURE FIBERWR #2 38 T-5 BLUE (SUTURE) IMPLANT
SUTURE FIBERWR #5 38 CONV NDL (SUTURE) IMPLANT
TIBIA PLATE PROX 6 H 35MM (Orthopedic Implant) ×1 IMPLANT
TOWEL GREEN STERILE (TOWEL DISPOSABLE) ×2 IMPLANT
TOWEL GREEN STERILE FF (TOWEL DISPOSABLE) ×2 IMPLANT
TRAY FOLEY MTR SLVR 16FR STAT (SET/KITS/TRAYS/PACK) IMPLANT
TUBE CONNECTING 12X1/4 (SUCTIONS) ×1 IMPLANT
UNDERPAD 30X36 HEAVY ABSORB (UNDERPADS AND DIAPERS) ×1 IMPLANT
WATER STERILE IRR 1000ML POUR (IV SOLUTION) ×2 IMPLANT
YANKAUER SUCT BULB TIP NO VENT (SUCTIONS) ×1 IMPLANT

## 2023-03-27 NOTE — Progress Notes (Signed)
Orthopedic Tech Progress Note Patient Details:  Harry Morales [birth year] [id number] Called in [employer] for ROM knee brace.  Patient ID: Harry Morales, male   DOB: 04-21-1971, 52 y.o.   MRN: [id number]  Blase Mess 03/27/2023, 6:11 PM

## 2023-03-27 NOTE — Transfer of Care (Signed)
Immediate Anesthesia Transfer of Care Note  Patient: Harry Morales  Procedure(s) Performed: OPEN REDUCTION INTERNAL FIXATION (ORIF) TIBIAL PLATEAU (Left: Leg Lower) REMOVAL EXTERNAL FIXATION LEG (Left: Leg Lower)  Patient Location: PACU  Anesthesia Type:General  Level of Consciousness: awake, alert , and oriented  Airway & Oxygen Therapy: Patient Spontanous Breathing and Patient connected to nasal cannula oxygen  Post-op Assessment: Report given to RN and Post -op Vital signs reviewed and stable  Post vital signs: Reviewed and stable  Last Vitals:  Vitals Value Taken Time  BP    Temp    Pulse 111 03/27/23 1637  Resp 23 03/27/23 1637  SpO2 100 % 03/27/23 1637  Vitals shown include unfiled device data.  Last Pain:  Vitals:   03/27/23 0917  TempSrc:   PainSc: 0-No pain      Patients Stated Pain Goal: 3 (03/26/23 2142)  Complications: No notable events documented.

## 2023-03-27 NOTE — Progress Notes (Signed)
[facility] Pre-op Review 1.Surgical orders: Consent orders and signed: completed Is patient alert and oriented to sign consent Antibiotic:   2.  Pre-procedure checklist completed: yes  3.  NPO: since 0001 10/15  4.  CHG Bath completed: Belongings removed and placed in clean gown: yes  5.  Labs Performed: CBC WNL CMP/BMP  WNL Type and Screen NA Surgical PCR: completed  EKG: NA Chest x-ray:NA   6.  Recent H&P or progress note if inpatient:yes   7.  Language Barrier:  none  8.  Vital Signs: stable Oxygen: RA Tele: na Can the patient travel without Tele: yes  9.  Medications: Cardiac Drips:  Pain Medications: tylenol 0553 10/15, Robaxin LD 10/14, Oxy IR LD 10/14 Beta Blocker: na Anticoagulants: lovenox due at 0800 10/15 GLP1:na  Pre-op Medications Ordered:  10. IV access: 22 RFA  11. Diabetic:   NA  12. Additional Information that short stay should be aware of? none

## 2023-03-27 NOTE — Anesthesia Procedure Notes (Signed)
Procedure Name: Intubation Date/Time: 03/27/2023 11:09 AM  Performed by: Marena Chancy, CRNAPre-anesthesia Checklist: Patient identified, Emergency Drugs available, Suction available and Patient being monitored Patient Re-evaluated:Patient Re-evaluated prior to induction Oxygen Delivery Method: Circle System Utilized Preoxygenation: Pre-oxygenation with 100% oxygen Induction Type: IV induction Ventilation: Mask ventilation without difficulty Laryngoscope Size: Miller and 2 Grade View: Grade I Tube type: Oral Tube size: 7.0 mm Number of attempts: 1 Airway Equipment and Method: Stylet and Oral airway Placement Confirmation: ETT inserted through vocal cords under direct vision, positive ETCO2 and breath sounds checked- equal and bilateral Tube secured with: Tape Dental Injury: Teeth and Oropharynx as per pre-operative assessment

## 2023-03-27 NOTE — Progress Notes (Signed)
No changes overnight.  The risks and benefits of surgery for left tibial plateau repair were discussed with the patient, including the possibility of infection, nerve injury, vessel injury, wound breakdown, arthritis, symptomatic hardware, DVT/ PE, loss of motion, malunion, nonunion, and need for further surgery among others. These risks were acknowledged and consent provided to proceed.  Myrene Galas, MD [facility], [facility] [phone]

## 2023-03-27 NOTE — Plan of Care (Signed)
  Problem: Education: Goal: Knowledge of General Education information will improve Description: Including pain rating scale, medication(s)/side effects and non-pharmacologic comfort measures Outcome: Progressing   Problem: Activity: Goal: Risk for activity intolerance will decrease Outcome: Progressing   Problem: Coping: Goal: Level of anxiety will decrease Outcome: Progressing   

## 2023-03-27 NOTE — Anesthesia Postprocedure Evaluation (Signed)
Anesthesia Post Note  Patient: Kass Herberger Shams  Procedure(s) Performed: OPEN REDUCTION INTERNAL FIXATION (ORIF) TIBIAL PLATEAU (Left: Leg Lower) REMOVAL EXTERNAL FIXATION LEG (Left: Leg Lower)     Patient location during evaluation: PACU Anesthesia Type: General Level of consciousness: sedated and patient cooperative Pain management: pain level controlled Vital Signs Assessment: post-procedure vital signs reviewed and stable Respiratory status: spontaneous breathing Cardiovascular status: stable Anesthetic complications: no   No notable events documented.  Last Vitals:  Vitals:   03/27/23 1730 03/27/23 1748  BP: (!) 141/88 (!) 143/83  Pulse: (!) 113   Resp: 13 17  Temp: 37.2 C 37 C  SpO2: 95% 96%    Last Pain:  Vitals:   03/27/23 1748  TempSrc: Oral  PainSc:                  Lewie Loron

## 2023-03-28 LAB — CBC
HCT: 28.8 % — ABNORMAL LOW (ref 39.0–52.0)
Hemoglobin: 9.7 g/dL — ABNORMAL LOW (ref 13.0–17.0)
MCH: 29.8 pg (ref 26.0–34.0)
MCHC: 33.7 g/dL (ref 30.0–36.0)
MCV: 88.3 fL (ref 80.0–100.0)
Platelets: 404 10*3/uL — ABNORMAL HIGH (ref 150–400)
RBC: 3.26 MIL/uL — ABNORMAL LOW (ref 4.22–5.81)
RDW: 13.8 % (ref 11.5–15.5)
WBC: 12.4 10*3/uL — ABNORMAL HIGH (ref 4.0–10.5)
nRBC: 0 % (ref 0.0–0.2)

## 2023-03-28 LAB — BASIC METABOLIC PANEL
Anion gap: 11 (ref 5–15)
BUN: 11 mg/dL (ref 6–20)
CO2: 24 mmol/L (ref 22–32)
Calcium: 8.7 mg/dL — ABNORMAL LOW (ref 8.9–10.3)
Chloride: 100 mmol/L (ref 98–111)
Creatinine, Ser: 0.99 mg/dL (ref 0.61–1.24)
GFR, Estimated: >60 mL/min (ref >60)
Glucose, Bld: 136 mg/dL — ABNORMAL HIGH (ref 70–99)
Potassium: 3.5 mmol/L (ref 3.5–5.1)
Sodium: 135 mmol/L (ref 135–145)

## 2023-03-28 LAB — MYOGLOBIN, URINE: Myoglobin, Ur: 2 ng/mL (ref 0–13)

## 2023-03-28 MED ORDER — KETOROLAC TROMETHAMINE 15 MG/ML IJ SOLN
15.0000 mg | Freq: Three times a day (TID) | INTRAMUSCULAR | Status: DC
  Administered 2023-03-28 – 2023-03-30 (×6): 15 mg via INTRAVENOUS
  Filled 2023-03-28 (×6): qty 1

## 2023-03-28 NOTE — TOC Initial Note (Signed)
Transition of Care [facility]) - Initial/Assessment Note    Patient Details  Name: Harry Morales MRN: [id number] Date of Birth: 28-Oct-1970  Transition of Care [facility]) CM/SW Contact:    Lorri Frederick, LCSW Phone Number: 03/28/2023, 3:54 PM  Clinical Narrative:     CSW spoke with pt for initial assessment.  Pt from home with cousin, Harry Morales, [phone].  No current services.  Pt mother currently resides in nursing home.  Permission given to speak with mother our Harry Morales.  Harry Morales does work but is available for some assistance at home.  Pt agreeable to [facility] services at DC.  Pt does not have current DME at home.                Expected Discharge Plan: Home w Home Health Services Barriers to Discharge: Continued Medical Work up   Patient Goals and CMS Choice Patient states their goals for this hospitalization and ongoing recovery are:: get back to work          Expected Discharge Plan and Services In-house Referral: Clinical Social Work   Post Acute Care Choice: Home Health Living arrangements for the past 2 months: Single Family Home                                      Prior Living Arrangements/Services Living arrangements for the past 2 months: Single Family Home Lives with:: Relatives (lives with Cousin: Harry Morales) Patient language and need for interpreter reviewed:: Yes Do you feel safe going back to the place where you live?: Yes      Need for Family Participation in Patient Care: No (Comment) Care giver support system in place?: Yes (comment) Current home services: Other (comment) (none) Criminal Activity/Legal Involvement Pertinent to Current Situation/Hospitalization: No - Comment as needed  Activities of Daily Living      Permission Sought/Granted Permission sought to share information with : Family Supports Permission granted to share information with : Yes, Verbal Permission Granted  Share Information with NAME: mother Harry Morales, cousin Harry Morales            Emotional Assessment Appearance:: Appears stated age Attitude/Demeanor/Rapport: Engaged Affect (typically observed): Appropriate, Pleasant Orientation: : Oriented to Self, Oriented to Place, Oriented to  Time, Oriented to Situation      Admission diagnosis:  Tibial plateau fracture, left [S82.142A] Closed fracture of left tibial plateau, initial encounter [S82.142A] Left medial tibial plateau fracture [S82.132A] Closed fracture of proximal end of left humerus, unspecified fracture morphology, initial encounter [S42.202A] Closed bicondylar fracture of left tibial plateau [S82.142A] Patient Active Problem List   Diagnosis Date Noted   Closed bicondylar fracture of left tibial plateau 03/20/2023   Tibial plateau fracture, left 03/19/2023   Left medial tibial plateau fracture 03/19/2023   Calculus of bile duct with cholecystitis and obstruction    Gall stones    Duodenitis    Pancreatitis 07/12/2018   Acute gallstone pancreatitis 07/11/2018   Hypertensive urgency 07/11/2018   Leukocytosis 07/11/2018   Nonintractable headache 05/18/2018   PCP:  Irena Reichmann, DO Pharmacy:   [facility] [phone] - Closed - , [place] - [address] [place] [address] Phone: [phone] Fax: [phone]  [id number] - [place], [place] - [address] AT [facility] [address] [place] [address] Phone: [phone] Fax: [phone]     Social Determinants of Health (  SDOH) Social History: SDOH Screenings   Tobacco Use: Low Risk  (03/27/2023)   SDOH Interventions:     Readmission Risk Interventions     No data to display

## 2023-03-28 NOTE — TOC Progression Note (Signed)
Transition of Care [facility]) - Progression Note    Patient Details  Name: Harry Morales MRN: [id number] Date of Birth: 31-Mar-1971  Transition of Care [facility]) CM/SW Contact  Epifanio Lesches, RN Phone Number: 03/28/2023, 4:11 PM  Clinical Narrative:     Pt agreeable to home health services. Pt without provider preference. Referral made with [facility] and accepted.  Expected Discharge Plan: Home w Home Health Services Barriers to Discharge: Continued Medical Work up  Expected Discharge Plan and Services In-house Referral: Clinical Social Work Discharge Planning Services: CM Consult Post Acute Care Choice: Home Health Living arrangements for the past 2 months: Single Family Home                           HH Arranged: PT, OT HH Agency: [facility] Date [facility] Agency Contacted: 03/28/23 Time HH Agency Contacted: 1609 Representative spoke with at [facility] Agency: Amy   Social Determinants of Health (SDOH) Interventions SDOH Screenings   Tobacco Use: Low Risk  (03/27/2023)    Readmission Risk Interventions     No data to display

## 2023-03-28 NOTE — Progress Notes (Signed)
SAQ, heel slides, stretching, prone flexion and extension  Ankle theraband program, heel cord stretching, toe towel curls, etc  No pillows under bend of knee when at rest, ok to place under heel to help work on extension. Can also use zero knee bone foam if available DO NOT LET KNEE REST IN FLEXION!!!!!    - Pain management:             Multimodal   Dc scheduled tylenol due to elevated LFTs   Scheduled toradol x 3 days   Oxy dose increased   Scheduled robaxin    Ice and elevate    - ABL anemia/Hemodynamics             Stable, monitor   - Medical issues              Bronchitis                          Home meds   - DVT/PE prophylaxis:             Lovenox             SCD to right leg  Convert to xarelto tomorrow x 30 days    - ID:              Perioperative antibiotics   - Metabolic Bone Disease:             Vitamin D levels look okay   - Activity:             As above   - FEN/GI prophylaxis/Foley/Lines:             Regular diet             Continue with gentle IVF hydration      - Dispo:             Ortho issues addressed  Therapy evals  Hoping to have ready for dc on Friday   Mearl Latin, PA-C [phone] (C) 03/28/2023, 9:33 AM  [facility] [address] [place] [address] [phone] Val [phone] (F)    After 5pm and on the weekends please log on to Amion, go to orthopaedics and the look under the [facility] Call for the provider(s) on call. You can also call our office at [phone] and then follow the prompts to be connected to the call team.  Patient ID: Harry Morales, male   DOB: [birth year], 52 y.o.   MRN: [id number]  [facility] Progress Note  Patient ID: Harry Morales MRN: [id number] DOB/AGE: 01/20/1971 52 y.o.  Subjective:  C/o sever pain in L knee but  mostly from knee immobilizer Ate breakfast this am   Eggs, bacon Voiding w/o difficulty  Denies any numbness or tingling   No CP or SOB  I did offer PCA but he is agreeable to continue with IV and PO meds   LFTs noted to be mildly elevate yesterday  I discontinued scheduled tylenol, also reviewed meds with pharmacist. No other offenders noted Will check CMET tomorrow.  LFTs were normal earlier in admission  Suspect this is transient   Overall vitals look good Mild tachycardia likely attributable to pain    ROS As above  Objective:   VITALS:   Vitals:   03/27/23 2020 03/28/23 0010 03/28/23 0626 03/28/23 0749  BP: 128/77 (!) 111/90 114/73 116/79  Pulse: (!) 115 (!) 118 (!) 106 (!) 101  Resp: 17 17 17 18   Temp: 98.5 F (36.9 C) 98.1 F (36.7 C) 97.8 F (36.6 C) 97.9 F (36.6 C)  TempSrc: Oral Oral    SpO2: 96% 93% 95% 95%  Weight:      Height:        Estimated body mass index is 24.03 kg/m as calculated from the following:   Height as of this encounter: 5\' 4"  (1.626 m).   Weight as of this encounter: 63.5 kg.   Intake/Output      10/15 0701 10/16 0700 10/16 0701 10/17 0700   P.O. 180    I.V. (mL/kg) 900 (14.2)    IV Piggyback 600    Total Intake(mL/kg) 1680 (26.5)    Urine (mL/kg/hr) 1650 (1.1) 150 (0.9)   Blood 250    Total Output 1900 150   Net -220 -150          LABS  Results for orders placed or performed during the hospital encounter of 03/19/23 (from the past 24 hour(s))  CBC     Status: Abnormal   Collection Time: 03/28/23  6:23 AM  Result Value Ref Range   WBC 12.4 (H) 4.0 - 10.5 K/uL   RBC 3.26 (L) 4.22 - 5.81 MIL/uL   Hemoglobin 9.7 (L) 13.0 - 17.0 g/dL   HCT 78.2 (L) 95.6 - 21.3 %   MCV 88.3 80.0 -  100.0 fL   MCH 29.8 26.0 - 34.0 pg   MCHC 33.7 30.0 - 36.0 g/dL   RDW 08.6 57.8 - 46.9 %   Platelets 404 (H) 150 - 400 K/uL   nRBC 0.0 0.0 - 0.2 %  Basic metabolic panel     Status: Abnormal   Collection Time: 03/28/23  6:23 AM  Result Value Ref Range   Sodium 135 135 - 145 mmol/L   Potassium 3.5 3.5 - 5.1 mmol/L   Chloride 100 98 - 111 mmol/L   CO2 24 22 - 32 mmol/L   Glucose, Bld 136 (H) 70 - 99 mg/dL   BUN 11 6 - 20 mg/dL   Creatinine, Ser 6.29 0.61 - 1.24 mg/dL   Calcium 8.7 (L) 8.9 - 10.3 mg/dL   GFR, Estimated >52 >84 mL/min   Anion gap 11 5 - 15     PHYSICAL EXAM:    Gen: in bed, NAD, looks good. Pleasant as  [facility] Progress Note  Patient ID: Harry Morales MRN: [id number] DOB/AGE: 01/20/1971 52 y.o.  Subjective:  C/o sever pain in L knee but  mostly from knee immobilizer Ate breakfast this am   Eggs, bacon Voiding w/o difficulty  Denies any numbness or tingling   No CP or SOB  I did offer PCA but he is agreeable to continue with IV and PO meds   LFTs noted to be mildly elevate yesterday  I discontinued scheduled tylenol, also reviewed meds with pharmacist. No other offenders noted Will check CMET tomorrow.  LFTs were normal earlier in admission  Suspect this is transient   Overall vitals look good Mild tachycardia likely attributable to pain    ROS As above  Objective:   VITALS:   Vitals:   03/27/23 2020 03/28/23 0010 03/28/23 0626 03/28/23 0749  BP: 128/77 (!) 111/90 114/73 116/79  Pulse: (!) 115 (!) 118 (!) 106 (!) 101  Resp: 17 17 17 18   Temp: 98.5 F (36.9 C) 98.1 F (36.7 C) 97.8 F (36.6 C) 97.9 F (36.6 C)  TempSrc: Oral Oral    SpO2: 96% 93% 95% 95%  Weight:      Height:        Estimated body mass index is 24.03 kg/m as calculated from the following:   Height as of this encounter: 5\' 4"  (1.626 m).   Weight as of this encounter: 63.5 kg.   Intake/Output      10/15 0701 10/16 0700 10/16 0701 10/17 0700   P.O. 180    I.V. (mL/kg) 900 (14.2)    IV Piggyback 600    Total Intake(mL/kg) 1680 (26.5)    Urine (mL/kg/hr) 1650 (1.1) 150 (0.9)   Blood 250    Total Output 1900 150   Net -220 -150          LABS  Results for orders placed or performed during the hospital encounter of 03/19/23 (from the past 24 hour(s))  CBC     Status: Abnormal   Collection Time: 03/28/23  6:23 AM  Result Value Ref Range   WBC 12.4 (H) 4.0 - 10.5 K/uL   RBC 3.26 (L) 4.22 - 5.81 MIL/uL   Hemoglobin 9.7 (L) 13.0 - 17.0 g/dL   HCT 78.2 (L) 95.6 - 21.3 %   MCV 88.3 80.0 -  100.0 fL   MCH 29.8 26.0 - 34.0 pg   MCHC 33.7 30.0 - 36.0 g/dL   RDW 08.6 57.8 - 46.9 %   Platelets 404 (H) 150 - 400 K/uL   nRBC 0.0 0.0 - 0.2 %  Basic metabolic panel     Status: Abnormal   Collection Time: 03/28/23  6:23 AM  Result Value Ref Range   Sodium 135 135 - 145 mmol/L   Potassium 3.5 3.5 - 5.1 mmol/L   Chloride 100 98 - 111 mmol/L   CO2 24 22 - 32 mmol/L   Glucose, Bld 136 (H) 70 - 99 mg/dL   BUN 11 6 - 20 mg/dL   Creatinine, Ser 6.29 0.61 - 1.24 mg/dL   Calcium 8.7 (L) 8.9 - 10.3 mg/dL   GFR, Estimated >52 >84 mL/min   Anion gap 11 5 - 15     PHYSICAL EXAM:    Gen: in bed, NAD, looks good. Pleasant as
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
## 2023-03-28 NOTE — Progress Notes (Signed)
PT Cancellation Note  Patient Details Name: Harry Morales MRN: [id number] DOB: 1970-11-08   Cancelled Treatment:    Reason Eval/Treat Not Completed: Other (comment) (Refusing due to pain.  Bledsoe brace has not arrived either but looks like ortho techs have ordered.  Will return as able.)   Bevelyn Buckles 03/28/2023, 9:39 AM Crystalina Stodghill M,PT Acute Rehab Services [phone]
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
## 2023-03-29 LAB — CBC
HCT: 26.1 % — ABNORMAL LOW (ref 39.0–52.0)
Hemoglobin: 8.5 g/dL — ABNORMAL LOW (ref 13.0–17.0)
MCH: 29.4 pg (ref 26.0–34.0)
MCHC: 32.6 g/dL (ref 30.0–36.0)
MCV: 90.3 fL (ref 80.0–100.0)
Platelets: 418 10*3/uL — ABNORMAL HIGH (ref 150–400)
RBC: 2.89 MIL/uL — ABNORMAL LOW (ref 4.22–5.81)
RDW: 14 % (ref 11.5–15.5)
WBC: 10 10*3/uL (ref 4.0–10.5)
nRBC: 0 % (ref 0.0–0.2)

## 2023-03-29 LAB — COMPREHENSIVE METABOLIC PANEL
ALT: 104 U/L — ABNORMAL HIGH (ref 0–44)
AST: 68 U/L — ABNORMAL HIGH (ref 15–41)
Albumin: 2.6 g/dL — ABNORMAL LOW (ref 3.5–5.0)
Alkaline Phosphatase: 146 U/L — ABNORMAL HIGH (ref 38–126)
Anion gap: 10 (ref 5–15)
BUN: 14 mg/dL (ref 6–20)
CO2: 28 mmol/L (ref 22–32)
Calcium: 9.1 mg/dL (ref 8.9–10.3)
Chloride: 101 mmol/L (ref 98–111)
Creatinine, Ser: 0.94 mg/dL (ref 0.61–1.24)
GFR, Estimated: >60 mL/min (ref >60)
Glucose, Bld: 121 mg/dL — ABNORMAL HIGH (ref 70–99)
Potassium: 3.9 mmol/L (ref 3.5–5.1)
Sodium: 139 mmol/L (ref 135–145)
Total Bilirubin: 0.8 mg/dL (ref 0.3–1.2)
Total Protein: 5.8 g/dL — ABNORMAL LOW (ref 6.5–8.1)

## 2023-03-29 NOTE — Progress Notes (Signed)
Regular diet             Continue with gentle IVF hydration      - Dispo:             Ortho issues addressed             Therapy evals             Plan for DC  home tomorrow       Mearl Latin, PA-C [phone] (C) 03/29/2023, 12:00 PM  [facility] [address] [place] [address] [phone] [phone] (F)    After 5pm and on the weekends please log on to Amion, go to orthopaedics and the look under the [facility] Call for the provider(s) on call. You can also call our office at [phone] and then follow the prompts to be connected to the call team.  Patient ID: Harry Morales, male   DOB: 1970/12/07, 52 y.o.   MRN: [id number]  [facility] Progress Note  Patient ID: Harry Morales MRN: [id number] DOB/AGE: [birth year] 52 y.o.  Subjective:  Pain much better today  Thinks he is on track to dc tomorrow   No other complaints   + flatus No BM  LFTs trending down  Likely transient elevation due to APAP  ROS As above Objective:   VITALS:   Vitals:   03/28/23 1459 03/28/23 2026 03/29/23 0519 03/29/23 0737  BP: 104/67 109/65 115/73 98/67  Pulse: (!) 103 (!) 110 (!) 102 94  Resp: 18 16 16 18   Temp: 98.5 F (36.9 C) 98.5 F (36.9 C) 98.3 F (36.8 C) 98 F (36.7 C)  TempSrc: Oral Oral Oral   SpO2: 92% 95% 94% 96%  Weight:      Height:        Estimated body mass index is 24.03 kg/m as calculated from the following:   Height as of this encounter: 5\' 4"  (1.626 m).   Weight as of this encounter: 63.5 kg.   Intake/Output      10/16 0701 10/17 0700 10/17 0701 10/18 0700   P.O. 600    I.V. (mL/kg) 311.5 (4.9)    IV Piggyback 100    Total Intake(mL/kg) 1011.5 (15.9)    Urine (mL/kg/hr) 1350 (0.9)    Blood     Total Output 1350    Net -338.5           LABS  Results for orders placed or performed during the hospital encounter of 03/19/23 (from the past 24 hour(s))  CBC     Status: Abnormal   Collection Time: 03/29/23  7:01 AM  Result Value Ref Range   WBC 10.0 4.0 - 10.5 K/uL   RBC 2.89 (L) 4.22 - 5.81 MIL/uL   Hemoglobin 8.5 (L) 13.0 - 17.0 g/dL   HCT 82.9 (L) 56.2 - 13.0 %   MCV 90.3 80.0 - 100.0 fL   MCH 29.4 26.0 - 34.0 pg   MCHC 32.6 30.0 - 36.0 g/dL   RDW 86.5 78.4 - 69.6 %   Platelets 418 (H) 150 - 400 K/uL   nRBC 0.0 0.0 - 0.2 %  Comprehensive metabolic panel     Status: Abnormal   Collection Time: 03/29/23  7:01 AM  Result Value Ref Range   Sodium 139 135 - 145 mmol/L   Potassium 3.9 3.5 - 5.1 mmol/L   Chloride 101 98 - 111 mmol/L   CO2 28 22 - 32 mmol/L   Glucose, Bld 121 (H) 70 -  99 mg/dL   BUN 14 6 - 20 mg/dL   Creatinine, Ser 2.95 0.61 - 1.24 mg/dL   Calcium 9.1 8.9 - 28.4 mg/dL   Total Protein 5.8 (L) 6.5 - 8.1 g/dL   Albumin 2.6 (L) 3.5 - 5.0 g/dL   AST 68 (H) 15 - 41 U/L   ALT 104 (H) 0 - 44 U/L   Alkaline Phosphatase 146 (H) 38 - 126 U/L   Total Bilirubin 0.8 0.3 - 1.2 mg/dL   GFR, Estimated >13 >24 mL/min   Anion gap 10 5 - 15     PHYSICAL EXAM:   Gen: Pleasant as always  Lungs: unlabored Cardiac: reg   Abd: + BS, NTND Ext:       Left Lower Extremity Dressing is clean,  Regular diet             Continue with gentle IVF hydration      - Dispo:             Ortho issues addressed             Therapy evals             Plan for DC  home tomorrow       Mearl Latin, PA-C [phone] (C) 03/29/2023, 12:00 PM  [facility] [address] [place] [address] [phone] [phone] (F)    After 5pm and on the weekends please log on to Amion, go to orthopaedics and the look under the [facility] Call for the provider(s) on call. You can also call our office at [phone] and then follow the prompts to be connected to the call team.  Patient ID: Harry Morales, male   DOB: 1970/12/07, 52 y.o.   MRN: [id number]  [facility]opaedic [facility]uma Service Progress Note  Patient ID: Harry Morales MRN: [id number] DOB/AGE: [birth year] 52 y.o.  Subjective:  Pain much better today  Thinks he is on track to dc tomorrow   No other complaints   + flatus No BM  LFTs trending down  Likely transient elevation due to APAP  ROS As above Objective:   VITALS:   Vitals:   03/28/23 1459 03/28/23 2026 03/29/23 0519 03/29/23 0737  BP: 104/67 109/65 115/73 98/67  Pulse: (!) 103 (!) 110 (!) 102 94  Resp: 18 16 16 18   Temp: 98.5 F (36.9 C) 98.5 F (36.9 C) 98.3 F (36.8 C) 98 F (36.7 C)  TempSrc: Oral Oral Oral   SpO2: 92% 95% 94% 96%  Weight:      Height:        Estimated body mass index is 24.03 kg/m as calculated from the following:   Height as of this encounter: 5\' 4"  (1.626 m).   Weight as of this encounter: 63.5 kg.   Intake/Output      10/16 0701 10/17 0700 10/17 0701 10/18 0700   P.O. 600    I.V. (mL/kg) 311.5 (4.9)    IV Piggyback 100    Total Intake(mL/kg) 1011.5 (15.9)    Urine (mL/kg/hr) 1350 (0.9)    Blood     Total Output 1350    Net -338.5           LABS  Results for orders placed or performed during the hospital encounter of 03/19/23 (from the past 24 hour(s))  CBC     Status: Abnormal   Collection Time: 03/29/23  7:01 AM  Result Value Ref Range   WBC 10.0 4.0 - 10.5 K/uL   RBC 2.89 (L) 4.22 - 5.81 MIL/uL   Hemoglobin 8.5 (L) 13.0 - 17.0 g/dL   HCT 82.9 (L) 56.2 - 13.0 %   MCV 90.3 80.0 - 100.0 fL   MCH 29.4 26.0 - 34.0 pg   MCHC 32.6 30.0 - 36.0 g/dL   RDW 86.5 78.4 - 69.6 %   Platelets 418 (H) 150 - 400 K/uL   nRBC 0.0 0.0 - 0.2 %  Comprehensive metabolic panel     Status: Abnormal   Collection Time: 03/29/23  7:01 AM  Result Value Ref Range   Sodium 139 135 - 145 mmol/L   Potassium 3.9 3.5 - 5.1 mmol/L   Chloride 101 98 - 111 mmol/L   CO2 28 22 - 32 mmol/L   Glucose, Bld 121 (H) 70 -  99 mg/dL   BUN 14 6 - 20 mg/dL   Creatinine, Ser 2.95 0.61 - 1.24 mg/dL   Calcium 9.1 8.9 - 28.4 mg/dL   Total Protein 5.8 (L) 6.5 - 8.1 g/dL   Albumin 2.6 (L) 3.5 - 5.0 g/dL   AST 68 (H) 15 - 41 U/L   ALT 104 (H) 0 - 44 U/L   Alkaline Phosphatase 146 (H) 38 - 126 U/L   Total Bilirubin 0.8 0.3 - 1.2 mg/dL   GFR, Estimated >13 >24 mL/min   Anion gap 10 5 - 15     PHYSICAL EXAM:   Gen: Pleasant as always  Lungs: unlabored Cardiac: reg   Abd: + BS, NTND Ext:       Left Lower Extremity Dressing is clean,

## 2023-03-29 NOTE — Progress Notes (Signed)
Orthopedic Tech Progress Note Patient Details:  Harry Morales Jul 10, 1970 [id number]  Ortho Devices Type of Ortho Device: Arm sling, Bone foam zero knee Ortho Device/Splint Location: LLE Ortho Device/Splint Interventions: Application, Ordered, Adjustment   Post Interventions Patient Tolerated: Well Instructions Provided: Care of device, Adjustment of device  Harry Morales 03/29/2023, 8:53 AM
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
## 2023-03-29 NOTE — TOC Progression Note (Addendum)
Transition of Care [facility]) - Progression Note    Patient Details  Name: Harry Morales MRN: [id number] Date of Birth: [birth year]  [facility] CM/SW Contact  Huston Foley Jacklynn Ganong, RN Phone Number: 03/29/2023, 3:11 PM  Clinical Narrative:     Case Manager spoke patient concerning need to locate another Rehab facility. He requests time to think about it. CM will call him back shortly and offer to send information to [facility] or [facility]  1510: Spoke with patient and he is agreeable to have information faxed to [facility] and if they can't accept him he wants to try [facility] in [place]. CM called Amy at [facility], faxed information to her at [phone]. TOC will continue to follow.   Expected Discharge Plan: Home w Home Health Services Barriers to Discharge: Continued Medical Work up  Expected Discharge Plan and Services In-house Referral: Clinical Social Work Discharge Planning Services: CM Consult Post Acute Care Choice: Home Health Living arrangements for the past 2 months: Single Family Home                           HH Arranged: PT, OT HH Agency: [facility] Date [facility] Agency Contacted: 03/28/23 Time HH Agency Contacted: 1609 Representative spoke with at [facility] Agency: Amy   Social Determinants of Health (SDOH) Interventions SDOH Screenings   Tobacco Use: Low Risk  (03/27/2023)    Readmission Risk Interventions     No data to display

## 2023-03-29 NOTE — Progress Notes (Signed)
  Inpatient Rehab Admissions Coordinator :  Per therapy change in recommendations, patient was screened for CIR candidacy by Ottie Glazier RN MSN.  At this time patient appears to be a potential candidate for CIR. We would not have a CIR bed for this patient to be admitted this week. Other rehab venues should be pursued since plans were for likely d/c tomorrow. Please call me with any questions.  Ottie Glazier RN MSN Admissions Coordinator [phone]

## 2023-03-29 NOTE — Addendum Note (Signed)
Addendum  created 03/29/23 1017 by Marena Chancy, CRNA   Flowsheet accepted, Intraprocedure Flowsheets edited
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
## 2023-03-30 ENCOUNTER — Other Ambulatory Visit (HOSPITAL_COMMUNITY): Payer: Self-pay

## 2023-03-30 ENCOUNTER — Encounter (HOSPITAL_COMMUNITY): Payer: Self-pay | Admitting: Orthopedic Surgery

## 2023-03-30 DIAGNOSIS — S42202A Unspecified fracture of upper end of left humerus, initial encounter for closed fracture: Secondary | ICD-10-CM | POA: Diagnosis present

## 2023-03-30 HISTORY — DX: Unspecified fracture of upper end of left humerus, initial encounter for closed fracture: S42.202A

## 2023-03-30 LAB — CBC
HCT: 23.8 % — ABNORMAL LOW (ref 39.0–52.0)
Hemoglobin: 7.7 g/dL — ABNORMAL LOW (ref 13.0–17.0)
MCH: 28.5 pg (ref 26.0–34.0)
MCHC: 32.4 g/dL (ref 30.0–36.0)
MCV: 88.1 fL (ref 80.0–100.0)
Platelets: 420 10*3/uL — ABNORMAL HIGH (ref 150–400)
RBC: 2.7 MIL/uL — ABNORMAL LOW (ref 4.22–5.81)
RDW: 13.8 % (ref 11.5–15.5)
WBC: 8.6 10*3/uL (ref 4.0–10.5)
nRBC: 0 % (ref 0.0–0.2)

## 2023-03-30 MED ORDER — METHOCARBAMOL 500 MG PO TABS
500.0000 mg | ORAL_TABLET | Freq: Four times a day (QID) | ORAL | 0 refills | Status: AC | PRN
  Filled 2023-03-30: qty 60, 8d supply, fill #0

## 2023-03-30 MED ORDER — RIVAROXABAN 15 MG PO TABS
15.0000 mg | ORAL_TABLET | Freq: Every day | ORAL | 0 refills | Status: AC
Start: 2023-03-30 — End: 2023-04-29
  Filled 2023-03-30: qty 30, 30d supply, fill #0

## 2023-03-30 MED ORDER — DOCUSATE SODIUM 100 MG PO CAPS
100.0000 mg | ORAL_CAPSULE | Freq: Two times a day (BID) | ORAL | 0 refills | Status: AC
  Filled 2023-03-30: qty 10, 5d supply, fill #0

## 2023-03-30 MED ORDER — OXYCODONE HCL 5 MG PO TABS
5.0000 mg | ORAL_TABLET | Freq: Four times a day (QID) | ORAL | 0 refills | Status: AC | PRN
  Filled 2023-03-30: qty 50, 7d supply, fill #0

## 2023-03-30 NOTE — Discharge Instructions (Signed)
Orthopaedic Trauma Service Discharge Instructions   General Discharge Instructions  Orthopaedic Injuries:  Left proximal humerus fracture (shoulder) treated with open reduction and internal fixation using a plate and screws  Left bicondylar tibial plateau fracture (knee) treated with open reduction and internal fixation using plate and screws  WEIGHT BEARING STATUS: Nonweightbearing left leg for 8 weeks.  No lifting pushing or pulling with left arm for 6 weeks.   RANGE OF MOTION/ACTIVITY: Activity as tolerated while maintaining weightbearing restrictions noted above.  Unrestricted range of motion to left ankle and left knee.  Continue with range of motion exercises for left shoulder that were taught to you by therapy.  Shoulder pendulums only in or out of the sling.  Unrestricted range of motion of left elbow, forearm, wrist and hand  Bone health: Vitamin D levels look excellent  Review the following resource for additional information regarding bone health  [email or web address].com.cy  Wound Care: Daily wound care as needed.  Okay to leave incisions open to the air once there is no drainage.  Would recommend leaving them open is much as possible.  Okay to clean with soap and water only.  See instructions below  Discharge Wound Care Instructions  Do NOT apply any ointments, solutions or lotions to pin sites or surgical wounds.  These prevent needed drainage and even though solutions like hydrogen peroxide kill bacteria, they also damage cells lining the pin sites that help fight infection.  Applying lotions or ointments can keep the wounds moist and can cause them to breakdown and open up as well. This can increase the risk for infection. When in doubt call the office.  Surgical incisions should be dressed daily.  If any drainage is noted, use one layer of adaptic or Mepitel, then gauze, Kerlix, and an ace wrap for the leg.  You can use 4 x 4 gauze and tape for the left  shoulder  [email or web address].cz [email or web address]-[email or web address]/?pd_rd_[email or web address]&[email or web address]  [email or web address].[email or web address]  These dressing supplies should be available at local medical supply stores ([employer], [employer], etc). They are not usually carried at places like [facility], [facility], [employer], etc  Once the incision is completely dry and without drainage, it may be left open to air out.  Showering may begin 36-48 hours later.  Cleaning gently with soap and water.  DVT/PE prophylaxis: Xarelto 15 mg tablets daily for 30 days for blood clot prevention  Diet: as you were eating previously.  Can use over the counter stool softeners and bowel preparations, such as Miralax, to help with bowel movements.  Narcotics can be constipating.  Be sure to drink plenty of fluids  PAIN MEDICATION USE AND EXPECTATIONS  You have likely been given narcotic medications to help control your pain.  After a traumatic event that results in an fracture (broken bone) with or without surgery, it is ok to use narcotic pain medications to help control one's pain.  We understand that everyone responds to pain differently and each individual patient will be evaluated on a regular basis for the continued need for narcotic medications. Ideally, narcotic medication use should last no more than 6-8 weeks (coinciding with fracture healing).   As a patient it is your responsibility as well to monitor narcotic medication use and report the amount and frequency you use these medications when you come to your office visit.   We would also advise that if you are using narcotic medications, you should take a dose prior to therapy to maximize you participation.  IF YOU ARE ON  NARCOTIC MEDICATIONS IT IS NOT PERMISSIBLE TO OPERATE A MOTOR VEHICLE  (MOTORCYCLE/CAR/TRUCK/MOPED) OR HEAVY MACHINERY DO NOT MIX NARCOTICS WITH OTHER CNS (CENTRAL NERVOUS SYSTEM) DEPRESSANTS SUCH AS ALCOHOL   POST-OPERATIVE OPIOID TAPER INSTRUCTIONS: It is important to wean off of your opioid medication as soon as possible. If you do not need pain medication after your surgery it is ok to stop day one. Opioids include: Codeine, Hydrocodone(Norco, Vicodin), Oxycodone(Percocet, oxycontin) and hydromorphone amongst others.  Long term and even short term use of opiods can cause: Increased pain response Dependence Constipation Depression Respiratory depression And more.  Withdrawal symptoms can include Flu like symptoms Nausea, vomiting And more Techniques to manage these symptoms Hydrate well Eat regular healthy meals Stay active Use relaxation techniques(deep breathing, meditating, yoga) Do Not substitute Alcohol to help with tapering If you have been on opioids for less than two weeks and do not have pain than it is ok to stop all together.  Plan to wean off of opioids This plan should start within one week post op of your fracture surgery  Maintain the same interval or time between taking each dose and first decrease the dose.  Cut the total daily intake of opioids by one tablet each day Next start to increase the time between doses. The last dose that should be eliminated is the evening dose.    STOP SMOKING OR USING NICOTINE PRODUCTS!!!!  As discussed nicotine severely impairs your body's ability to heal surgical and traumatic wounds but also impairs bone healing.  Wounds and bone heal by forming microscopic blood vessels (angiogenesis) and nicotine is a vasoconstrictor (essentially, shrinks blood vessels).  Therefore, if vasoconstriction occurs to these microscopic blood vessels they essentially disappear and are unable to deliver necessary nutrients to the healing tissue.  This is one modifiable factor that you can do to dramatically increase your  chances of healing your injury.    (This means no smoking, no nicotine gum, patches, etc)  DO NOT USE NONSTEROIDAL ANTI-INFLAMMATORY DRUGS (NSAID'S)  Using products such as Advil (ibuprofen), Aleve (naproxen), Motrin (ibuprofen) for additional pain control during fracture healing can delay and/or prevent the healing response.  If you would like to take over the counter (OTC) medication, Tylenol (acetaminophen) is ok.  However, some narcotic medications that are given for pain control contain acetaminophen as well. Therefore, you should not exceed more than 4000 mg of tylenol in a day if you do not have liver disease.  Also note that there are may OTC medicines, such as cold medicines and allergy medicines that my contain tylenol as well.  If you have any questions about medications and/or interactions please ask your doctor/PA or your pharmacist.      ICE AND ELEVATE INJURED/OPERATIVE EXTREMITY  Using ice and elevating the injured extremity above your heart can help with swelling and pain control.  Icing in a pulsatile fashion, such as 20 minutes on and 20 minutes off, can be followed.    Do not place ice directly on skin. Make sure there is a barrier between to skin and the ice pack.    Using frozen items such as frozen peas works well as the conform nicely to the are that needs to be iced.  USE AN ACE WRAP OR TED HOSE FOR SWELLING CONTROL  In addition to icing and elevation, Ace wraps or TED hose are used to help limit and resolve swelling.  It is recommended to use Ace wraps or TED hose until you are informed to  stop.    When using Ace Wraps start the wrapping distally (farthest away from the body) and wrap proximally (closer to the body)   Example: If you had surgery on your leg or thing and you do not have a splint on, start the ace wrap at the toes and work your way up to the thigh        If you had surgery on your upper extremity and do not have a splint on, start the ace wrap at your fingers  and work your way up to the upper arm  IF YOU ARE IN A SPLINT OR CAST DO NOT REMOVE IT FOR ANY REASON   If your splint gets wet for any reason please contact the office immediately. You may shower in your splint or cast as long as you keep it dry.  This can be done by wrapping in a cast cover or garbage back (or similar)  Do Not stick any thing down your splint or cast such as pencils, money, or hangers to try and scratch yourself with.  If you feel itchy take benadryl as prescribed on the bottle for itching  IF YOU ARE IN A CAM BOOT (BLACK BOOT)  You may remove boot periodically. Perform daily dressing changes as noted below.  Wash the liner of the boot regularly and wear a sock when wearing the boot. It is recommended that you sleep in the boot until told otherwise    Call office for the following: Temperature greater than 101F Persistent nausea and vomiting Severe uncontrolled pain Redness, tenderness, or signs of infection (pain, swelling, redness, odor or green/yellow discharge around the site) Difficulty breathing, headache or visual disturbances Hives Persistent dizziness or light-headedness Extreme fatigue Any other questions or concerns you may have after discharge  In an emergency, call 911 or go to an Emergency Department at a nearby hospital  HELPFUL INFORMATION  If you had a block, it will wear off between 8-24 hrs postop typically.  This is period when your pain may go from nearly zero to the pain you would have had postop without the block.  This is an abrupt transition but nothing dangerous is happening.  You may take an extra dose of narcotic when this happens.  You should wean off your narcotic medicines as soon as you are able.  Most patients will be off or using minimal narcotics before their first postop appointment.   We suggest you use the pain medication the first night prior to going to bed, in order to ease any pain when the anesthesia wears off. You should  avoid taking pain medications on an empty stomach as it will make you nauseous.  Do not drink alcoholic beverages or take illicit drugs when taking pain medications.  In most states it is against the law to drive while you are in a splint or sling.  And certainly against the law to drive while taking narcotics.  You may return to work/school in the next couple of days when you feel up to it.   Pain medication may make you constipated.  Below are a few solutions to try in this order: Decrease the amount of pain medication if you aren't having pain. Drink lots of decaffeinated fluids. Drink prune juice and/or each dried prunes  If the first 3 don't work start with additional solutions Take Colace - an over-the-counter stool softener Take Senokot - an over-the-counter laxative Take Miralax - a stronger over-the-counter laxative     CALL  THE OFFICE WITH ANY QUESTIONS OR CONCERNS: [phone]   VISIT OUR WEBSITE FOR ADDITIONAL INFORMATION: [email or web address]

## 2023-03-30 NOTE — Progress Notes (Signed)
He is really only bed to chair due to his ipsilateral humerus fracture                Float heel off bed. Zero knee bone foam ordered as well              No pillows under bend of knee                PT- please teach HEP for L knee ROM- AROM, PROM. No ROM restrictions.  Quad sets, SLR, LAQ, SAQ, heel slides, stretching, prone flexion and extension   Ankle theraband program, heel cord stretching, toe towel curls, etc   No pillows under bend of knee when at rest, ok to place under heel to help work on extension. Can also use zero knee bone foam if available DO NOT LET KNEE REST IN FLEXION!!!!!     - Pain management:             Multimodal                         Dc home with oxy and robaxin     - ABL anemia/Hemodynamics             Asymptomatic              Monitor    - Medical issues              Bronchitis                         Home meds   - DVT/PE prophylaxis:             Lovenox             SCD to right leg             Xarelto at dc x 30 days   - ID:              Perioperative antibiotics completed    - Metabolic Bone Disease:             Vitamin D levels look okay   - Activity:             As above   - FEN/GI prophylaxis/Foley/Lines:             Regular diet             Dc IV     - Dispo:             Discharge home today  Follow-up with orthopedics in 10 days  DME arranged.  Home health arranged.  Meds sent to [facility] pharmacy  Mearl Latin,  PA-C [phone] (C) 03/30/2023, 12:04 PM  [facility] [address] [place] [address] [phone] Val [phone] (F)    After 5pm and on the weekends please log on to Amion, go to orthopaedics and the look under the [facility] Call for the provider(s) on call. You can also call our office at [phone] and then follow the prompts to be connected to the call team.  Patient ID: Harry Morales, male   DOB: 04/04/1971, 52 y.o.   MRN: [id number]  Orthopaedic Trauma Service Progress Note  Patient ID: Harry Morales MRN: [id number] DOB/AGE: 1970/08/03 52 y.o.  Subjective:  No new issues Pain controlled Wants to go home   DME ordered  Meds to be sent to [facility] pharmacy    ROS As above  Objective:   VITALS:   Vitals:   03/30/23 0400 03/30/23 0400 03/30/23 0750 03/30/23 0954  BP: 92/65 92/65 (!) 92/55 97/65  Pulse: 98 95 86   Resp: 18 18 18    Temp: 98.5 F (36.9 C) 98.5 F (36.9 C) 98 F (36.7 C)   TempSrc: Oral Oral Oral   SpO2: 96% 94% 96%   Weight:      Height:        Estimated body mass index is 24.03 kg/m as calculated from the following:   Height as of this encounter: 5\' 4"  (1.626 m).   Weight as of this encounter: 63.5 kg.   Intake/Output      10/17 0701 10/18 0700 10/18 0701 10/19 0700   P.O.     I.V. (mL/kg)     IV Piggyback     Total Intake(mL/kg)     Urine (mL/kg/hr) 300 (0.2)    Total Output 300    Net -300           LABS  Results for orders placed or performed during the hospital encounter of 03/19/23 (from the past 24 hour(s))  CBC     Status: Abnormal   Collection Time: 03/30/23  7:52 AM  Result Value Ref Range   WBC 8.6 4.0 - 10.5 K/uL   RBC 2.70 (L) 4.22 - 5.81 MIL/uL   Hemoglobin 7.7 (L) 13.0 - 17.0 g/dL   HCT 08.6 (L) 57.8 - 46.9 %   MCV 88.1 80.0 - 100.0 fL   MCH 28.5 26.0 - 34.0 pg   MCHC 32.4 30.0 - 36.0 g/dL   RDW 62.9 52.8 - 41.3 %   Platelets 420 (H) 150 - 400 K/uL   nRBC 0.0 0.0 - 0.2 %     PHYSICAL EXAM:   Gen: Pleasant as always  Lungs: unlabored Cardiac: reg   Abd: + BS, NTND Ext:       Left Lower Extremity Dressings are clean, dry and intact to lower leg  Hinged brace fitting well             Extremity is warm             No DCT             Compartments are soft             No pain out of proportion with passive stretching of his toes or ankle             Ankle is resting  in flexion             DPN, SPN, TN sensory functions are intact             EHL, FHL, lesser toe motor functions intact             Much improved ankle motion              + DP pulse       Left upper extremity  He is really only bed to chair due to his ipsilateral humerus fracture                Float heel off bed. Zero knee bone foam ordered as well              No pillows under bend of knee                PT- please teach HEP for L knee ROM- AROM, PROM. No ROM restrictions.  Quad sets, SLR, LAQ, SAQ, heel slides, stretching, prone flexion and extension   Ankle theraband program, heel cord stretching, toe towel curls, etc   No pillows under bend of knee when at rest, ok to place under heel to help work on extension. Can also use zero knee bone foam if available DO NOT LET KNEE REST IN FLEXION!!!!!     - Pain management:             Multimodal                         Dc home with oxy and robaxin     - ABL anemia/Hemodynamics             Asymptomatic              Monitor    - Medical issues              Bronchitis                         Home meds   - DVT/PE prophylaxis:             Lovenox             SCD to right leg             Xarelto at dc x 30 days   - ID:              Perioperative antibiotics completed    - Metabolic Bone Disease:             Vitamin D levels look okay   - Activity:             As above   - FEN/GI prophylaxis/Foley/Lines:             Regular diet             Dc IV     - Dispo:             Discharge home today  Follow-up with orthopedics in 10 days  DME arranged.  Home health arranged.  Meds sent to [facility] pharmacy  Mearl Latin,  PA-C [phone] (C) 03/30/2023, 12:04 PM  [facility] [address] [place] [address] [phone] Val [phone] (F)    After 5pm and on the weekends please log on to Amion, go to orthopaedics and the look under the [facility] Call for the provider(s) on call. You can also call our office at [phone] and then follow the prompts to be connected to the call team.  Patient ID: Harry Morales, male   DOB: 04/04/1971, 52 y.o.   MRN: [id number]
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
## 2023-03-30 NOTE — TOC Progression Note (Signed)
Transition of Care [facility]) - Progression Note    Patient Details  Name: Harry Morales MRN: [id number] Date of Birth: [birth year]  Transition of Care [facility]) CM/SW Contact  Huston Foley Jacklynn Ganong, RN Phone Number: 03/30/2023, 10:18 AM  Clinical Narrative:     Case Manager has resent information to [facility] IP rehab and contacted Maryagnes Amos with [facility], information faxed to her as well. TOC will continue to follow.    Expected Discharge Plan: Home w Home Health Services Barriers to Discharge: Continued Medical Work up  Expected Discharge Plan and Services In-house Referral: Clinical Social Work Discharge Planning Services: CM Consult Post Acute Care Choice: Home Health Living arrangements for the past 2 months: Single Family Home                           HH Arranged: PT, OT HH Agency: [facility] Date [facility] Agency Contacted: 03/28/23 Time HH Agency Contacted: 1609 Representative spoke with at [facility] Agency: Amy   Social Determinants of Health (SDOH) Interventions SDOH Screenings   Tobacco Use: Low Risk  (03/27/2023)    Readmission Risk Interventions     No data to display

## 2023-03-30 NOTE — Progress Notes (Addendum)
Pt has DC order. AVS was given to pt, all questions were all answered. Home medication was prepared by TOC and was given to pt. Wheelchair and walker were delivered and  was picked up by a pt's friend. Awaiting for PTAR for pick up, all personal belongings were packed.

## 2023-03-30 NOTE — TOC Transition Note (Signed)
Transition of Care [facility]) - CM/SW Discharge Note   Patient Details  Name: Harry Morales MRN: [id number] Date of Birth: 1971-01-10  Transition of Care [facility]) CM/SW Contact:  Durenda Guthrie, RN Phone Number: 03/30/2023, 12:36 PM   Clinical Narrative:    [facility] has declined patient. Per MD and therapy patient will be able to discharge home with Home Health. Patient was setup on yesterday with [facility] and CM has updated them concerning discharge plan. DME will be delivered to patient's room by [facility] and a friend will get them. Case Manager has arranged for PTAR transport home for patient, pickup will be at 3pm. CM has discussed all details with patient. Medical Necessity form completed and bedside RN updated.    Final next level of care: Home w Home Health Services Barriers to Discharge: No Barriers Identified   Patient Goals and CMS Choice   Choice offered to / list presented to : Patient  Discharge Placement                         Discharge Plan and Services Additional resources added to the After Visit Summary for   In-house Referral: Clinical Social Work Discharge Planning Services: CM Consult Post Acute Care Choice: Durable Medical Equipment          DME Arranged: Dan Humphreys rolling, Wheelchair manual DME Agency: [employer] Date DME Agency Contacted: 03/30/23 Time DME Agency Contacted: 1209   HH Arranged: PT, OT HH Agency: [facility] Date HH Agency Contacted: 03/28/23 Time HH Agency Contacted: 1609 Representative spoke with at [facility] Agency: Amy  Social Determinants of Health (SDOH) Interventions SDOH Screenings   Tobacco Use: Low Risk  (03/27/2023)     Readmission Risk Interventions     No data to display
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
## 2023-04-01 NOTE — Plan of Care (Signed)
[facility] Tonsillectomy/Adenoidectomy, Postoperative PEDS care plan entered in error.

## 2023-04-04 ENCOUNTER — Encounter (HOSPITAL_COMMUNITY): Payer: Self-pay | Admitting: Orthopedic Surgery
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
## 2023-07-02 ENCOUNTER — Encounter (HOSPITAL_BASED_OUTPATIENT_CLINIC_OR_DEPARTMENT_OTHER): Payer: Self-pay

## 2023-07-02 ENCOUNTER — Ambulatory Visit (HOSPITAL_BASED_OUTPATIENT_CLINIC_OR_DEPARTMENT_OTHER): Admit: 2023-07-02 | Payer: 59 | Admitting: Orthopedic Surgery

## 2023-07-02 SURGERY — MANIPULATION, KNEE, CLOSED
Anesthesia: Choice | Site: Shoulder | Laterality: Left

## 2024-02-12 ENCOUNTER — Other Ambulatory Visit: Payer: Self-pay

## 2024-02-12 ENCOUNTER — Emergency Department (HOSPITAL_COMMUNITY)
Admission: EM | Admit: 2024-02-12 | Discharge: 2024-02-12 | Disposition: A | Discharge status: Home / Self Care | Attending: Emergency Medicine | Admitting: Emergency Medicine

## 2024-02-12 ENCOUNTER — Encounter (HOSPITAL_COMMUNITY): Payer: Self-pay

## 2024-02-12 DIAGNOSIS — S0181XA Laceration without foreign body of other part of head, initial encounter: Secondary | ICD-10-CM | POA: Diagnosis not present

## 2024-02-12 DIAGNOSIS — W260XXA Contact with knife, initial encounter: Secondary | ICD-10-CM | POA: Insufficient documentation

## 2024-02-12 DIAGNOSIS — Z7901 Long term (current) use of anticoagulants: Secondary | ICD-10-CM | POA: Diagnosis not present

## 2024-02-12 DIAGNOSIS — S0993XA Unspecified injury of face, initial encounter: Secondary | ICD-10-CM | POA: Diagnosis present

## 2024-02-12 MED ORDER — BACITRACIN ZINC 500 UNIT/GM EX OINT
TOPICAL_OINTMENT | Freq: Once | CUTANEOUS | Status: AC
  Administered 2024-02-12: 1 via TOPICAL
  Filled 2024-02-12: qty 0.9

## 2024-02-12 NOTE — ED Triage Notes (Signed)
 PT presents to ED with small lac to right lower lip.  He was cutting weedeater string when the knife slipped and hit his face.  Bleeding is controlled at this time.

## 2024-02-12 NOTE — ED Provider Notes (Signed)
 Harry Morales EMERGENCY DEPARTMENT AT [facility] Provider Note   CSN: [id number] Arrival date & time: 02/12/24  1235     Patient presents with: Laceration   Harry Morales is a 53 y.o. male who presents to the ED today with a superficial facial laceration to the mental prominence.  Was performing repair on a weed trimmer when the knife he was using to cut the tremor line slipped, causing a cut to his chin.  Noted profuse bleeding, controlled this with the direct pressure and presented to the emergency department.  Endorses having had a tetanus vaccination approximately 1 year ago.    Laceration      Prior to Admission medications   Medication Sig Start Date End Date Taking? Authorizing Provider  albuterol  (PROVENTIL  HFA;VENTOLIN  HFA) 108 (90 BASE) MCG/ACT inhaler 2 puffs every 4-6 hours as needed for chest tightness or wheezing. Patient not taking: Reported on 03/20/2023 06/15/14   Humberto Elspeth LABOR, MD  docusate sodium  (COLACE) 100 MG capsule Take 1 capsule (100 mg total) by mouth 2 (two) times daily. 03/30/23   Deward Eck, PA-C  methocarbamol  (ROBAXIN ) 500 MG tablet Take 1-2 tablets (500-1,000 mg total) by mouth every 6 (six) hours as needed for muscle spasms. 03/30/23   Deward Eck, PA-C  oxyCODONE  (OXY IR/ROXICODONE ) 5 MG immediate release tablet Take 1-2 tablets (5-10 mg total) by mouth every 6 (six) hours as needed for moderate pain (pain score 4-6) or severe pain (pain score 7-10) (pain score 4-6). 03/30/23   Deward Eck, PA-C  Rivaroxaban  (XARELTO ) 15 MG TABS tablet Take 1 tablet (15 mg total) by mouth daily with supper. 03/30/23 04/29/23  Deward Eck, PA-C  rosuvastatin  (CRESTOR ) 5 MG tablet Take 5 mg by mouth daily.    [provider]    Allergies: Amoxicillin and Penicillins    Review of Systems  Skin:  Positive for wound.  All other systems reviewed and are negative.   Updated Vital Signs BP 137/78 (BP Location: Right Arm)   Pulse (!) 103   Temp 98.3  F (36.8 C)   Resp 17   Ht 5' 3 (1.6 m)   Wt 63.5 kg   SpO2 96%   BMI 24.80 kg/m   Physical Exam Vitals and nursing note reviewed.  Constitutional:      General: He is not in acute distress.    Appearance: He is well-developed.  HENT:     Head: Normocephalic and atraumatic.  Eyes:     Conjunctiva/sclera: Conjunctivae normal.  Cardiovascular:     Rate and Rhythm: Normal rate and regular rhythm.     Heart sounds: No murmur heard. Pulmonary:     Effort: Pulmonary effort is normal. No respiratory distress.     Breath sounds: Normal breath sounds.  Abdominal:     Palpations: Abdomen is soft.     Tenderness: There is no abdominal tenderness.  Musculoskeletal:        General: No swelling.     Cervical back: Neck supple.  Skin:    General: Skin is warm and dry.     Capillary Refill: Capillary refill takes less than 2 seconds.     Findings: Laceration present.     Comments: Superficial laceration appreciated to the mental prominence, hemostasis achieved prior to arrival.  Neurological:     Mental Status: He is alert.  Psychiatric:        Mood and Affect: Mood normal.     (all labs ordered are listed, but only  abnormal results are displayed) Labs Reviewed - No data to display  EKG: None  Radiology: No results found.   Procedures   Medications Ordered in the ED  bacitracin  ointment (has no administration in time range)                                    Medical Decision Making Risk OTC drugs.   As this wound is very superficial, will have basic wound care with wound being cleaned and bacitracin  applied, defer closure as it is incredibly superficial and not amenable to primary closure.  This was explained to the patient, they understand and agree have no further concerns at this time.  Discussed keeping area clean with soap and water, careful return precautions been given, deferred tetanus vaccination as patient stated that they have had their tetanus  vaccination within the last year.     Final diagnoses:  Facial laceration, initial encounter    ED Discharge Orders     None          Myriam Dorn BROCKS, [place] 02/12/24 1416    Jerrol Agent, MD 02/12/24 [phone]
